# Patient Record
Sex: Female | Born: 1975 | Race: White | Hispanic: No | Marital: Married | Smoking: Current every day smoker
Health system: Southern US, Community
[De-identification: ages and names within clinical notes are randomized; demographics above are authoritative.]

## PROBLEM LIST (undated history)

## (undated) DIAGNOSIS — K219 Gastro-esophageal reflux disease without esophagitis: Secondary | ICD-10-CM

## (undated) DIAGNOSIS — M1990 Unspecified osteoarthritis, unspecified site: Secondary | ICD-10-CM

## (undated) DIAGNOSIS — F419 Anxiety disorder, unspecified: Secondary | ICD-10-CM

## (undated) DIAGNOSIS — W3400XA Accidental discharge from unspecified firearms or gun, initial encounter: Secondary | ICD-10-CM

## (undated) DIAGNOSIS — R112 Nausea with vomiting, unspecified: Secondary | ICD-10-CM

## (undated) DIAGNOSIS — N809 Endometriosis, unspecified: Secondary | ICD-10-CM

## (undated) DIAGNOSIS — K589 Irritable bowel syndrome without diarrhea: Secondary | ICD-10-CM

## (undated) DIAGNOSIS — Z9889 Other specified postprocedural states: Secondary | ICD-10-CM

## (undated) DIAGNOSIS — K4090 Unilateral inguinal hernia, without obstruction or gangrene, not specified as recurrent: Secondary | ICD-10-CM

## (undated) HISTORY — DX: Endometriosis, unspecified: N80.9

## (undated) HISTORY — PX: OTHER SURGICAL HISTORY: SHX169

## (undated) HISTORY — PX: SHOULDER SURGERY: SHX246

## (undated) HISTORY — DX: Irritable bowel syndrome, unspecified: K58.9

## (undated) HISTORY — PX: HERNIA REPAIR: SHX51

## (undated) HISTORY — PX: ABDOMINAL HYSTERECTOMY: SHX81

## (undated) HISTORY — DX: Gastro-esophageal reflux disease without esophagitis: K21.9

---

## 2002-08-28 ENCOUNTER — Other Ambulatory Visit: Admission: RE | Admit: 2002-08-28 | Discharge: 2002-08-28 | Payer: Self-pay | Admitting: Obstetrics and Gynecology

## 2003-03-05 ENCOUNTER — Encounter (INDEPENDENT_AMBULATORY_CARE_PROVIDER_SITE_OTHER): Payer: Self-pay | Admitting: Specialist

## 2003-03-05 ENCOUNTER — Observation Stay (HOSPITAL_COMMUNITY): Admission: AD | Admit: 2003-03-05 | Discharge: 2003-03-06 | Payer: Self-pay | Admitting: Obstetrics and Gynecology

## 2003-10-12 ENCOUNTER — Other Ambulatory Visit: Admission: RE | Admit: 2003-10-12 | Discharge: 2003-10-12 | Payer: Self-pay | Admitting: Obstetrics and Gynecology

## 2004-11-05 ENCOUNTER — Other Ambulatory Visit: Admission: RE | Admit: 2004-11-05 | Discharge: 2004-11-05 | Payer: Self-pay | Admitting: Obstetrics and Gynecology

## 2006-07-02 ENCOUNTER — Encounter: Admission: RE | Admit: 2006-07-02 | Discharge: 2006-07-02 | Payer: Self-pay | Admitting: General Surgery

## 2006-07-02 IMAGING — CT CT ABDOMEN W/ CM
2 of 5 series · 14 of 32 positions shown, 19 images · IV contrast (READICAT/WATER & [ID] OMNI 300)
Comparison: None.

CLINICAL DATA: Right flank and groin pain.  Nausea.  Fever.  Swelling in right groin region.  
 ABDOMEN CT WITH CONTRAST:
TECHNIQUE: Multidetector CT imaging of the abdomen was performed following the standard protocol during bolus administration of intravenous contrast.
 Contrast:  100 cc Omnipaque 300 and oral contrast.
TECHNIQUE: Multidetector CT imaging of the pelvis was performed following the standard protocol during bolus administration of intravenous contrast.

[Series 2: abdomen w/ · axial · 0.70mm/px · z∈[-416,-116]mm · 6 of 84 slices shown, 11 images]
[im 12/84  soft-tissue]
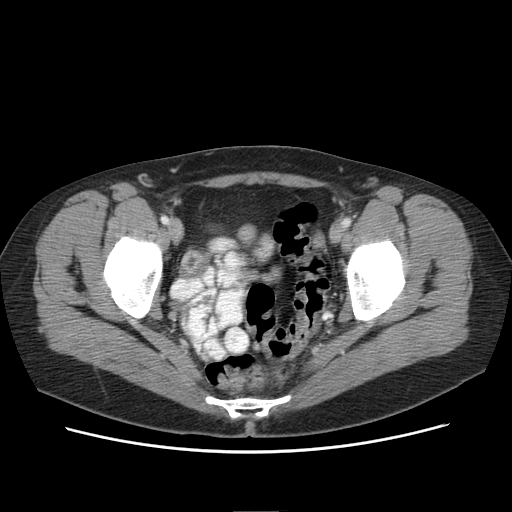
[im 12/84  bone]
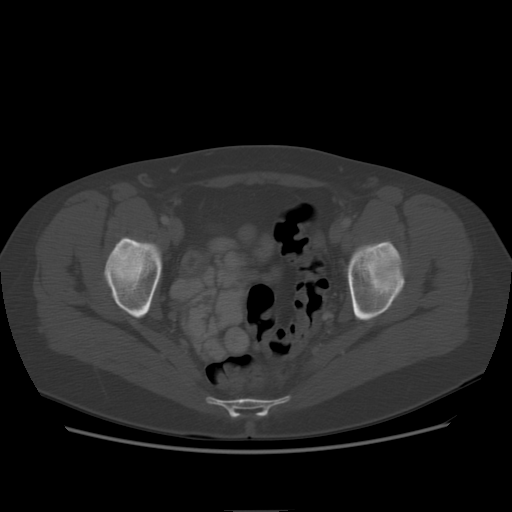
[im 24/84  soft-tissue]
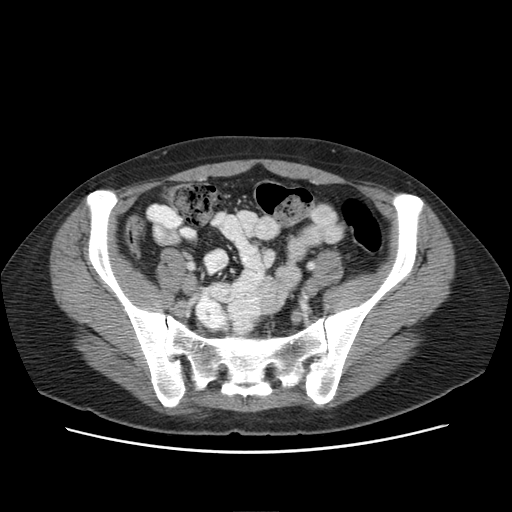
[im 36/84  soft-tissue]
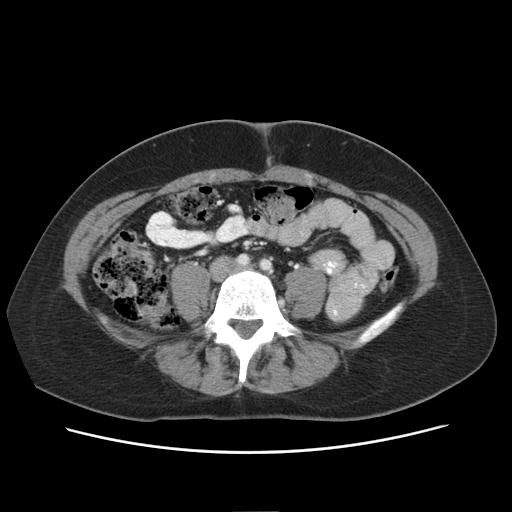
[im 36/84  lung]
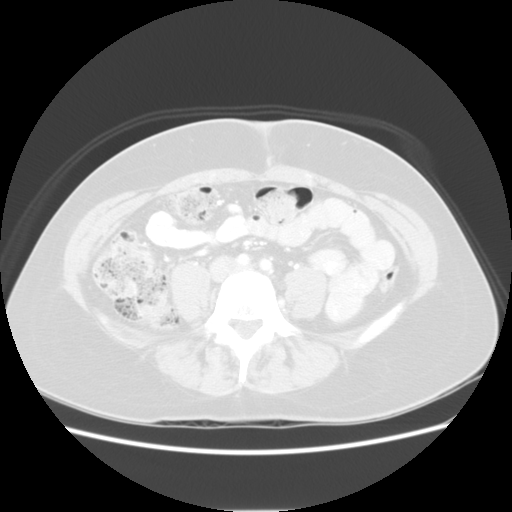
[im 48/84  soft-tissue]
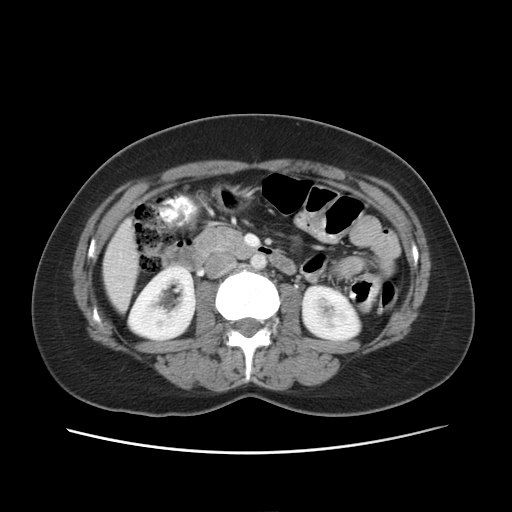
[im 48/84  lung]
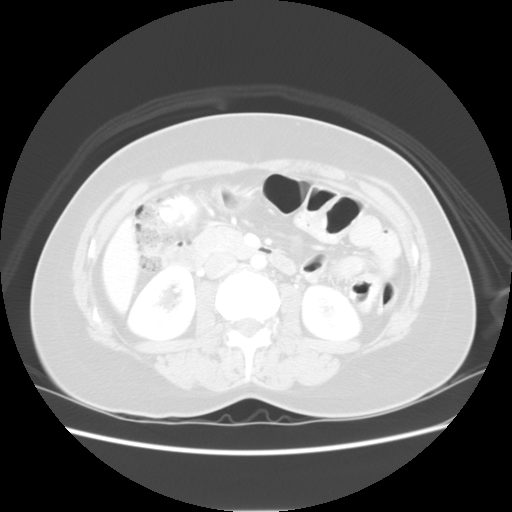
[im 60/84  soft-tissue]
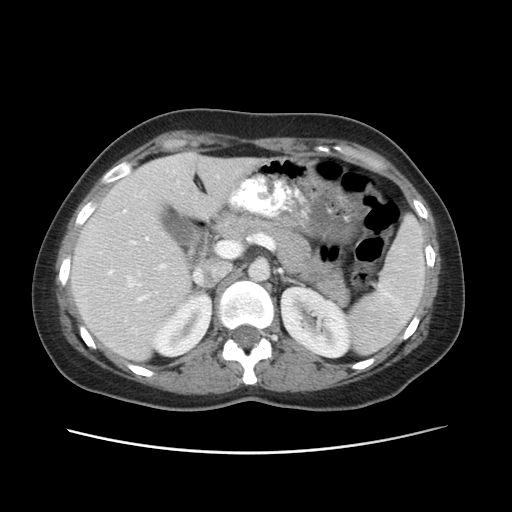
[im 60/84  lung]
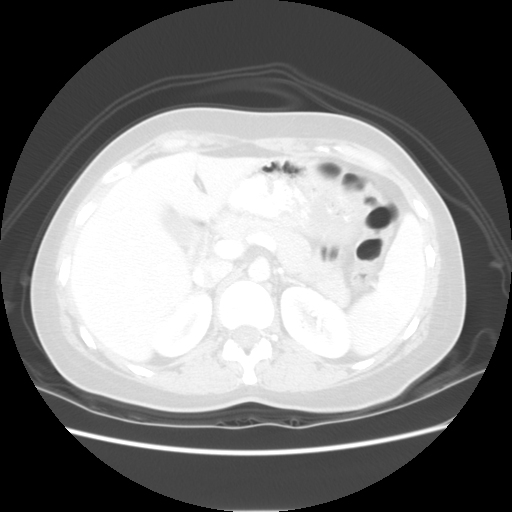
[im 72/84  soft-tissue]
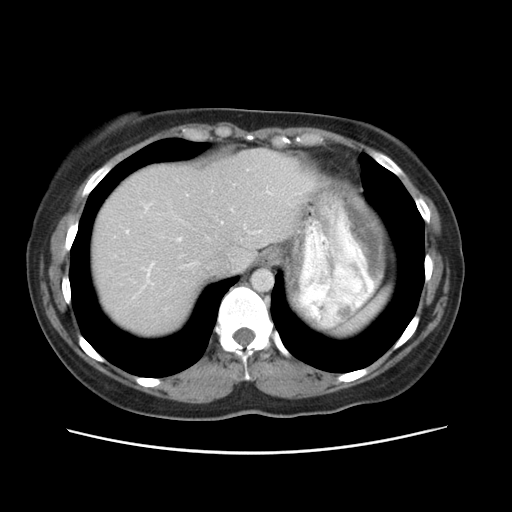
[im 72/84  lung]
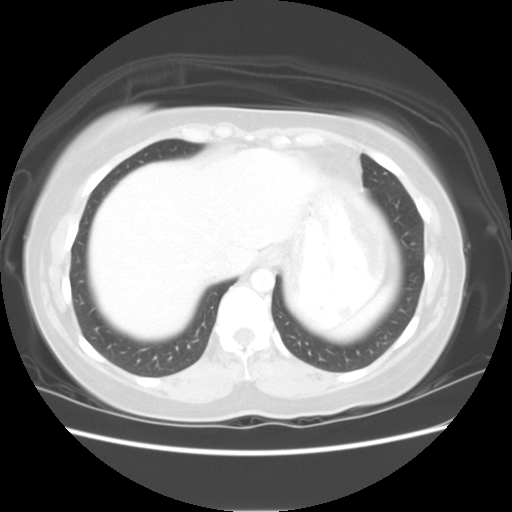

[Series 401: sagittal · sagittal · 0.89mm/px · 8 of 128 slices shown]
[im 12/128  soft-tissue]
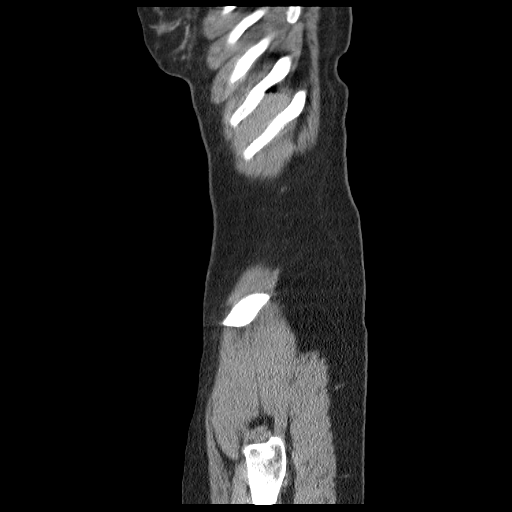
[im 24/128  soft-tissue]
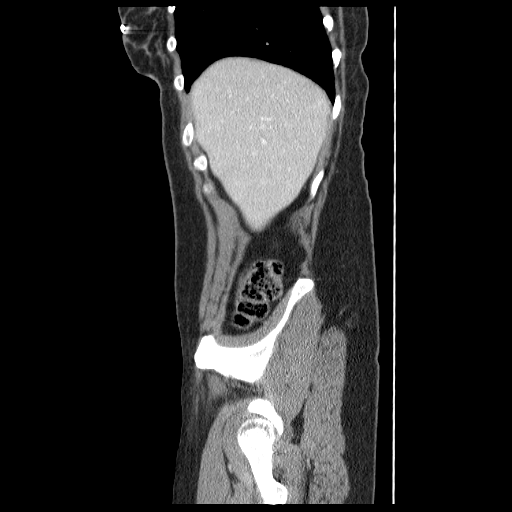
[im 47/128  soft-tissue]
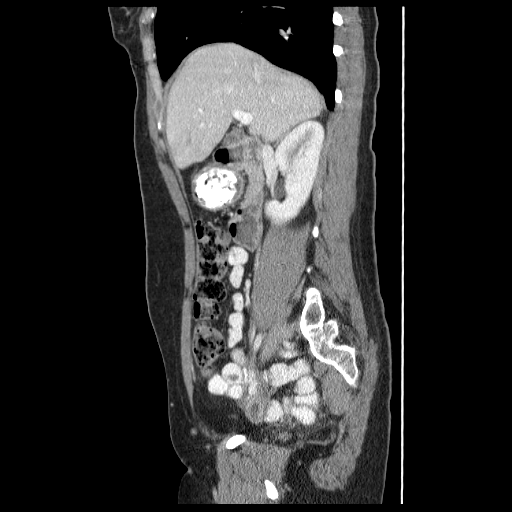
[im 58/128  soft-tissue]
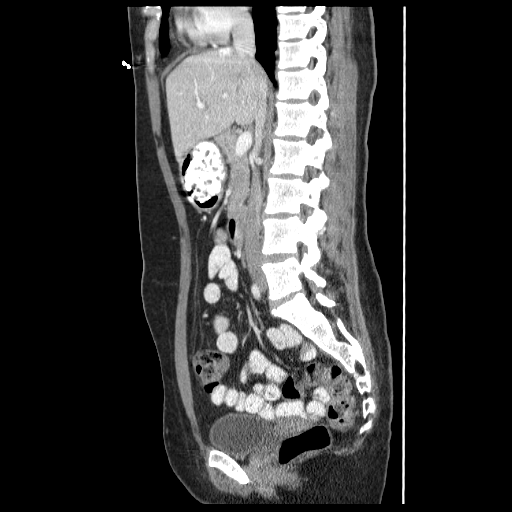
[im 70/128  soft-tissue]
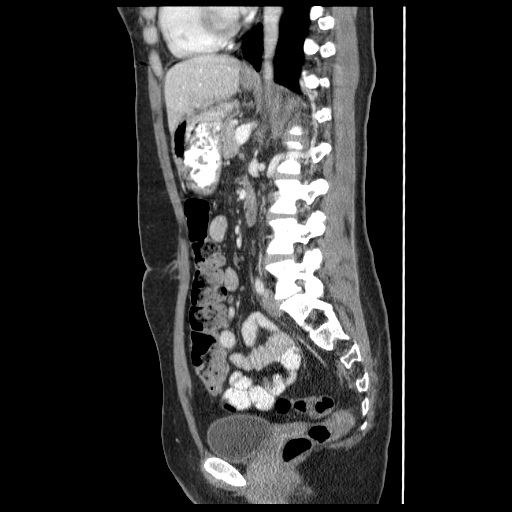
[im 81/128  soft-tissue]
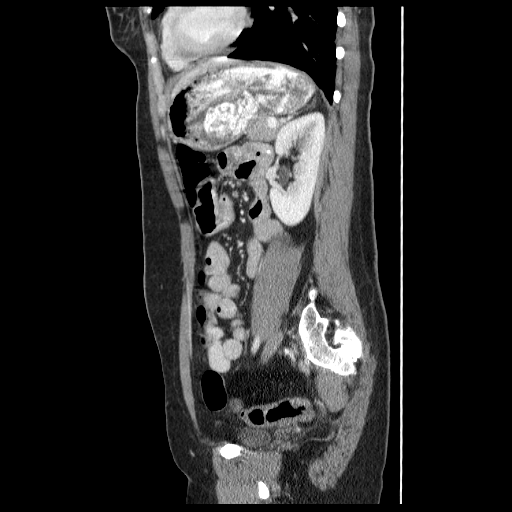
[im 104/128  soft-tissue]
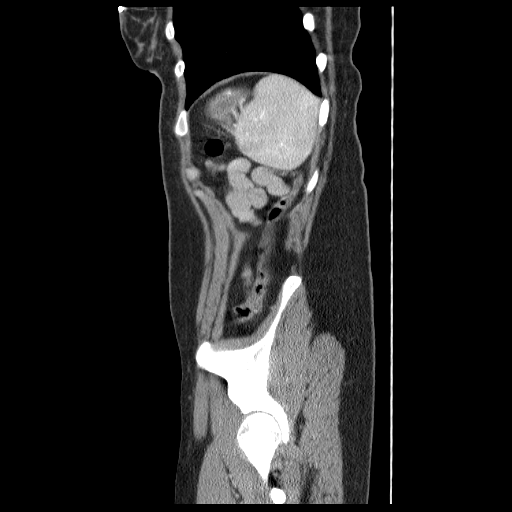
[im 116/128  soft-tissue]
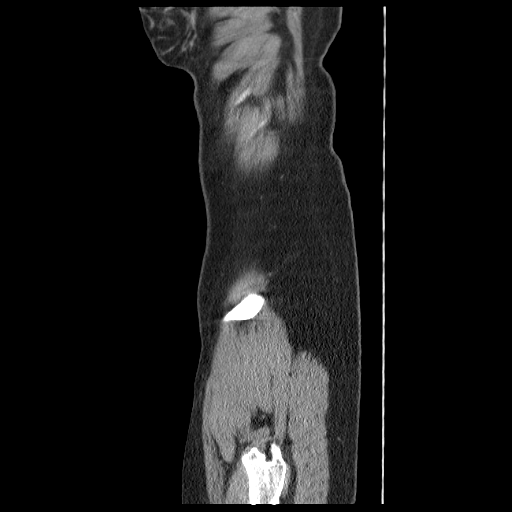

[14 of 32 positions shown; findings below may reference images not displayed]

FINDINGS: The abdominal parenchymal organs are normal in appearance.  Gallbladder is unremarkable.  There is no evidence of mass or adenopathy within the abdomen.  There is no evidence of inflammatory process or abnormal fluid collections.  No dilated bowel loops are seen.  Lung bases are clear.
IMPRESSION: Negative abdomen CT.  
 PELVIS CT WITH CONTRAST:
FINDINGS: Normal appendix is visualized.  There is no evidence of mass or adenopathy within the pelvis.  There is no evidence of hernia.  There is no evidence of inflammatory process or abnormal fluid collections.  No dilated bowel loops are seen.
IMPRESSION: Negative pelvis CT.

## 2007-11-24 ENCOUNTER — Ambulatory Visit: Payer: Self-pay | Admitting: Gastroenterology

## 2007-11-24 DIAGNOSIS — R1031 Right lower quadrant pain: Secondary | ICD-10-CM | POA: Insufficient documentation

## 2007-11-24 DIAGNOSIS — K5900 Constipation, unspecified: Secondary | ICD-10-CM | POA: Insufficient documentation

## 2007-11-24 DIAGNOSIS — K625 Hemorrhage of anus and rectum: Secondary | ICD-10-CM | POA: Insufficient documentation

## 2007-11-24 LAB — CONVERTED CEMR LAB
Basophils Absolute: 0 10*3/uL (ref 0.0–0.1)
Basophils Relative: 0.4 % (ref 0.0–1.0)
Bilirubin, Direct: 0.1 mg/dL (ref 0.0–0.3)
CO2: 27 meq/L (ref 19–32)
Calcium: 9.1 mg/dL (ref 8.4–10.5)
Creatinine, Ser: 0.8 mg/dL (ref 0.4–1.2)
Ferritin: 54.7 ng/mL (ref 10.0–291.0)
GFR calc Af Amer: 108 mL/min
Hemoglobin: 12.9 g/dL (ref 12.0–15.0)
Lymphocytes Relative: 39.2 % (ref 12.0–46.0)
MCHC: 33.9 g/dL (ref 30.0–36.0)
Monocytes Absolute: 0.4 10*3/uL (ref 0.1–1.0)
Monocytes Relative: 9.5 % (ref 3.0–12.0)
Neutro Abs: 1.9 10*3/uL (ref 1.4–7.7)
Neutrophils Relative %: 48.5 % (ref 43.0–77.0)
Platelets: 213 10*3/uL (ref 150–400)
Saturation Ratios: 19.6 % — ABNORMAL LOW (ref 20.0–50.0)
Sed Rate: 16 mm/hr (ref 0–22)
Sodium: 138 meq/L (ref 135–145)
TSH: 1.24 microintl units/mL (ref 0.35–5.50)
Total Bilirubin: 0.5 mg/dL (ref 0.3–1.2)
Total Protein: 7.3 g/dL (ref 6.0–8.3)
Vitamin B-12: 275 pg/mL (ref 211–911)

## 2008-06-21 ENCOUNTER — Ambulatory Visit: Payer: Self-pay | Admitting: Gastroenterology

## 2008-06-22 ENCOUNTER — Ambulatory Visit (HOSPITAL_COMMUNITY): Admission: RE | Admit: 2008-06-22 | Discharge: 2008-06-22 | Payer: Self-pay | Admitting: Obstetrics and Gynecology

## 2008-06-22 IMAGING — CT CT ABDOMEN WO/W CM
4 of 6 series · 15 of 32 positions shown, 19 images · IV contrast ([ID] READICAT & [ID] OMNIP 300%)
Comparison: [DATE]

CT ABDOMEN

CLINICAL DATA: Right lower abdominal and pelvic pain.  Lower GI
bleeding.  Evaluate for urinary calculi and appendicitis.

CT ABDOMEN WITHOUT AND WITH CONTRAST
CT PELVIS WITH CONTRAST
TECHNIQUE: Multidetector CT imaging of the abdomen was performed
initially following the standard protocol before administration of
intravenous contrast.  Multidetector CT imaging of the abdomen and
pelvis was then performed following the standard protocol during
the bolus injection of intravenous contrast.
Contrast: 100 ml [7J] and oral contrast

[Series 3: abd pelvis · axial · 0.70mm/px · z∈[-335,-135]mm · 3 of 82 slices shown, 7 images]
[im 21/82  soft-tissue]
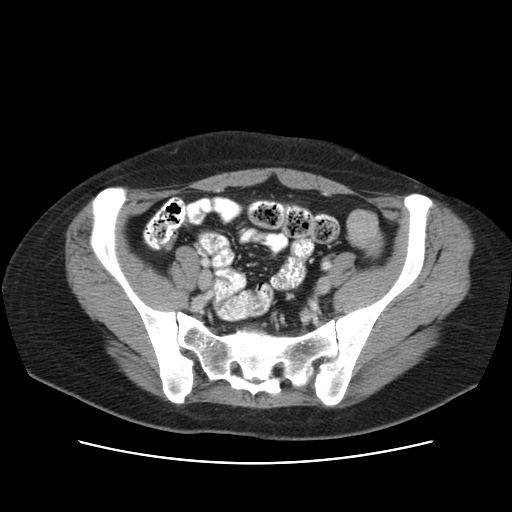
[im 21/82  lung]
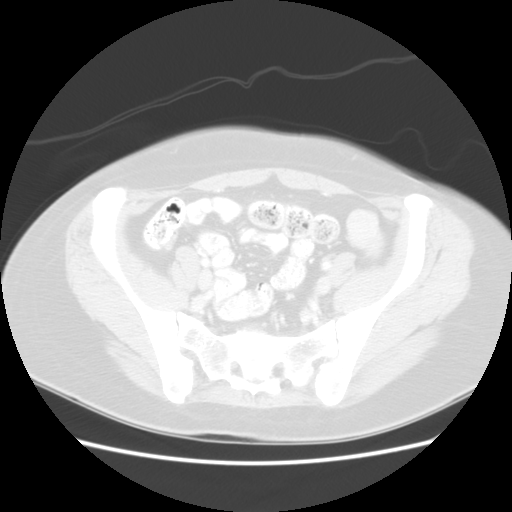
[im 21/82  bone]
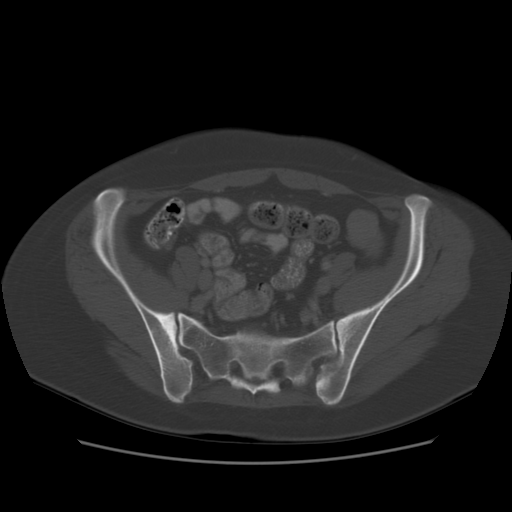
[im 41/82  soft-tissue]
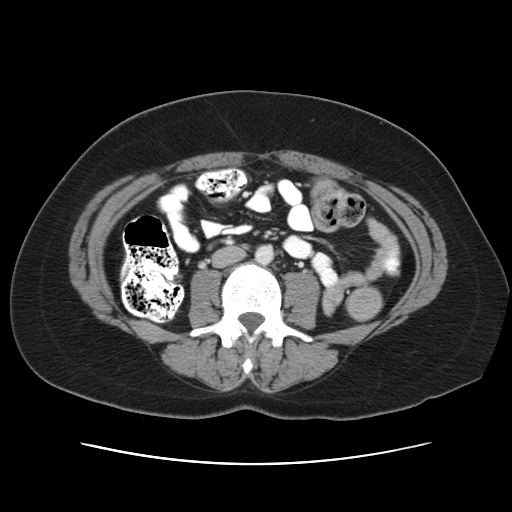
[im 41/82  lung]
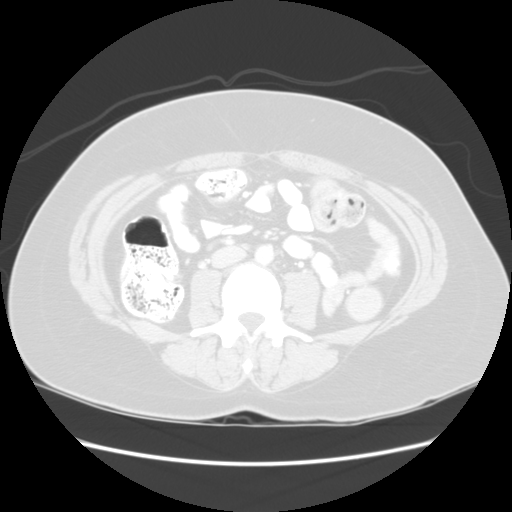
[im 61/82  soft-tissue]
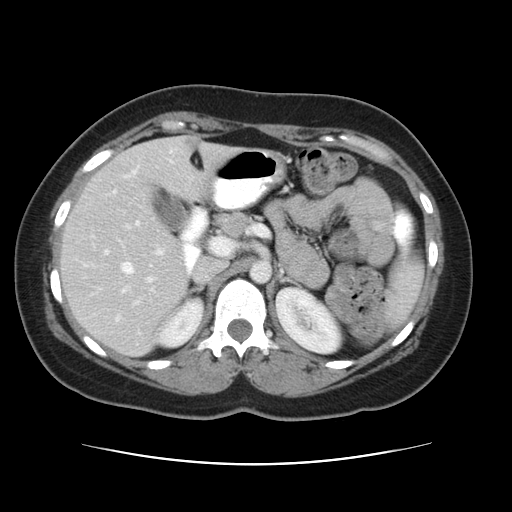
[im 61/82  lung]
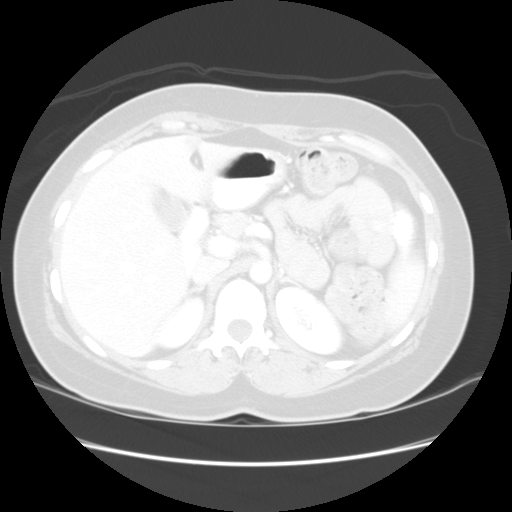

[Series 6: renal delay · axial · delayed · 0.70mm/px · z∈[-324,-214]mm · 2 of 67 slices shown]
[im 23/67  soft-tissue]
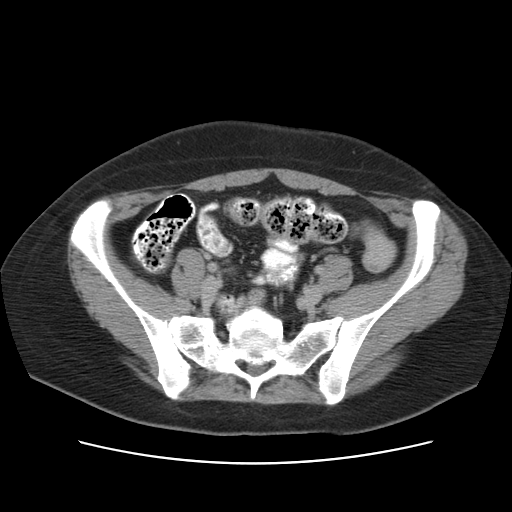
[im 45/67  soft-tissue]
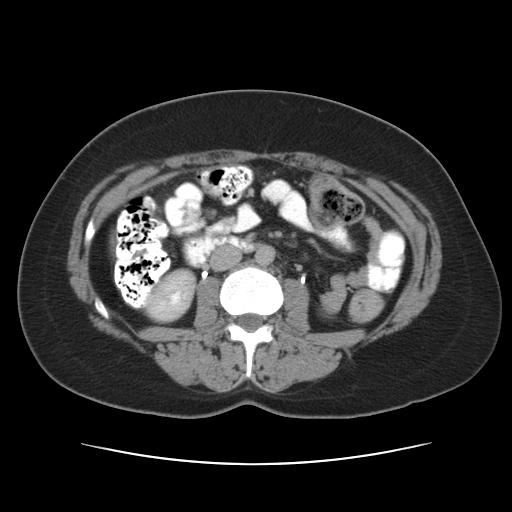

[Series 500: reformatted · coronal · 0.86mm/px · 2 of 146 slices shown (1 of 2)]
[im 19/146  soft-tissue]
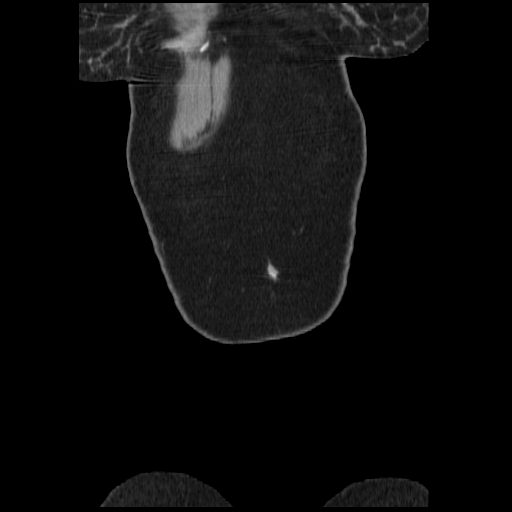
[im 37/146  soft-tissue]
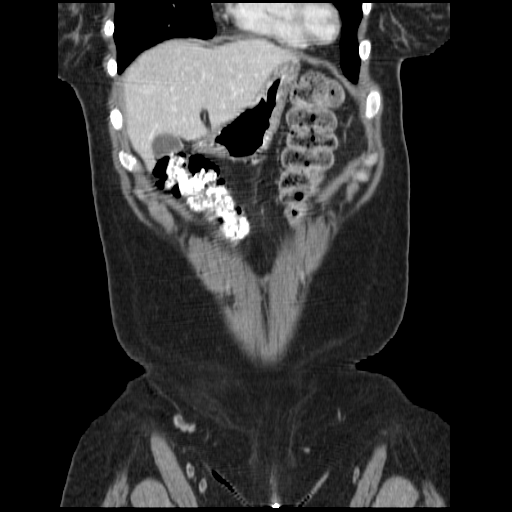

[Series 501: reformatted · sagittal · 0.86mm/px · 8 of 174 slices shown (2 of 2)]
[im 20/174  soft-tissue]
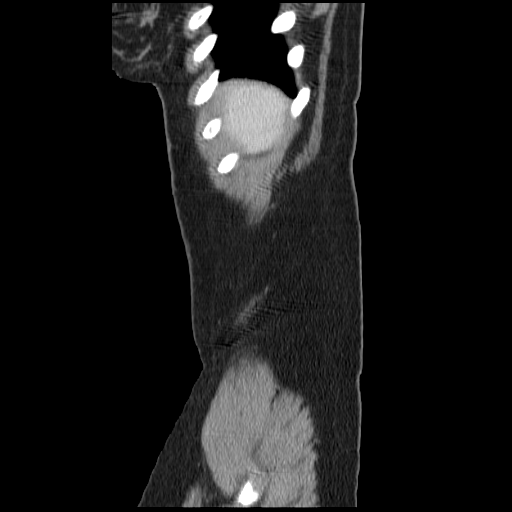
[im 39/174  soft-tissue]
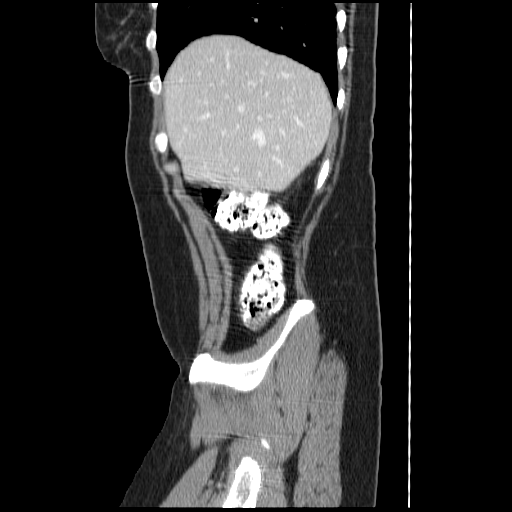
[im 58/174  soft-tissue]
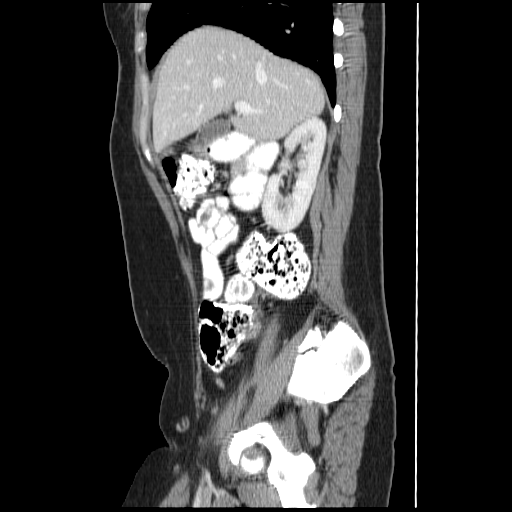
[im 77/174  soft-tissue]
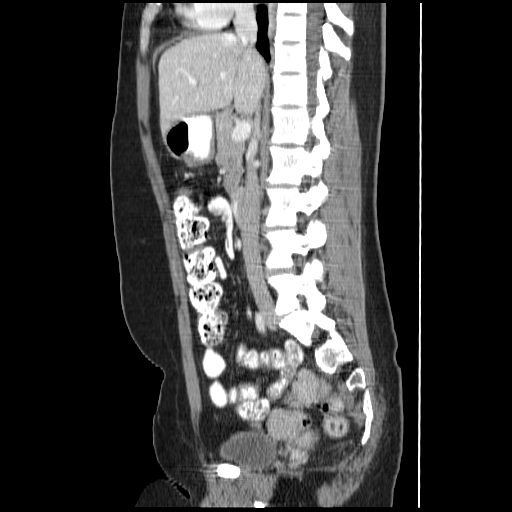
[im 97/174  soft-tissue]
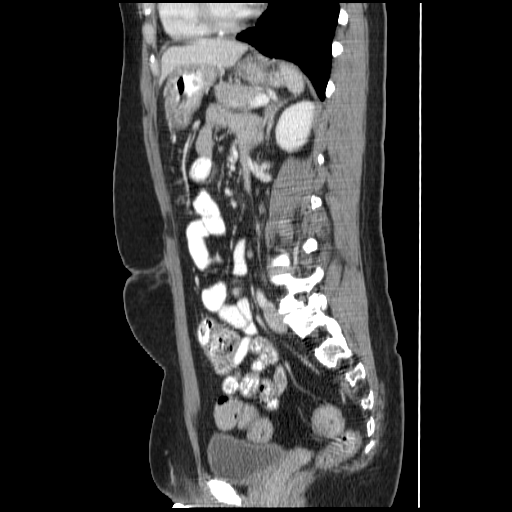
[im 116/174  soft-tissue]
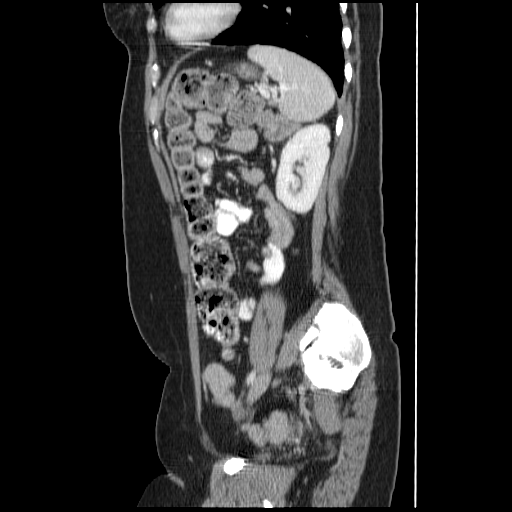
[im 135/174  soft-tissue]
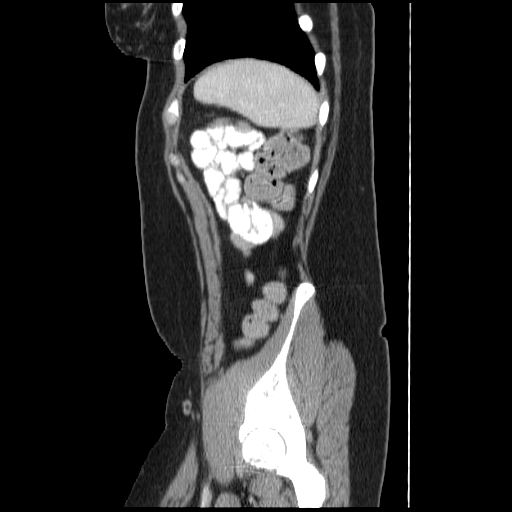
[im 154/174  soft-tissue]
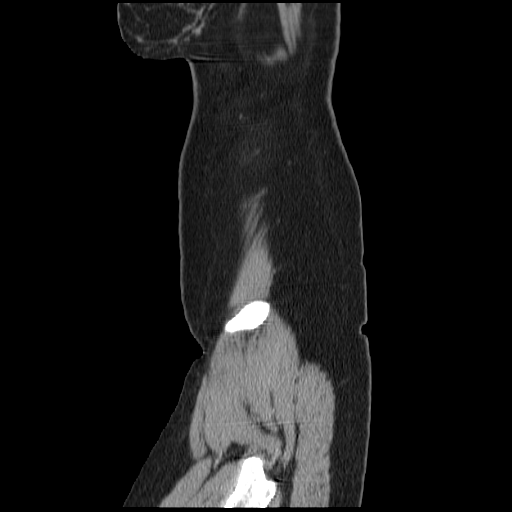

[15 of 32 positions shown; findings below may reference images not displayed]

FINDINGS: Precontrast images show no evidence renal calculi or
hydronephrosis.  Postcontrast images show no evidence of renal mass
or other parenchymal abnormality.

The liver, gallbladder, spleen, pancreas, and adrenal glands are
normal appearance.  There is no evidence of abdominal soft tissue
masses or lymphadenopathy.  There is no evidence of inflammatory
process or abnormal fluid collections.
IMPRESSION: Negative abdomen CT.

CT PELVIS
FINDINGS: There is no evidence of ureteral calculi or dilatation.
Normal contrast desiccation ureters and urinary bladder is seen on
delayed images.  Normal appendix is visualized.  Patient has
undergone previous hysterectomy.

There is no evidence of pelvic soft tissue masses or
lymphadenopathy.  There is no evidence of inflammatory process or
abnormal fluid collections.  No hernia identified.
IMPRESSION: Negative pelvis CT.

## 2008-06-28 ENCOUNTER — Telehealth: Payer: Self-pay | Admitting: Gastroenterology

## 2008-07-04 ENCOUNTER — Telehealth: Payer: Self-pay | Admitting: Gastroenterology

## 2008-07-04 DIAGNOSIS — R1319 Other dysphagia: Secondary | ICD-10-CM | POA: Insufficient documentation

## 2008-07-06 ENCOUNTER — Encounter: Payer: Self-pay | Admitting: Gastroenterology

## 2008-07-06 ENCOUNTER — Ambulatory Visit: Payer: Self-pay | Admitting: Gastroenterology

## 2008-07-06 DIAGNOSIS — K2970 Gastritis, unspecified, without bleeding: Secondary | ICD-10-CM | POA: Insufficient documentation

## 2008-07-06 DIAGNOSIS — K2990 Gastroduodenitis, unspecified, without bleeding: Secondary | ICD-10-CM

## 2008-07-09 ENCOUNTER — Encounter: Payer: Self-pay | Admitting: Gastroenterology

## 2008-07-10 ENCOUNTER — Encounter: Payer: Self-pay | Admitting: Gastroenterology

## 2008-07-20 ENCOUNTER — Ambulatory Visit: Payer: Self-pay | Admitting: Gastroenterology

## 2008-07-25 ENCOUNTER — Telehealth: Payer: Self-pay | Admitting: Gastroenterology

## 2008-08-03 ENCOUNTER — Telehealth (INDEPENDENT_AMBULATORY_CARE_PROVIDER_SITE_OTHER): Payer: Self-pay | Admitting: *Deleted

## 2008-08-10 ENCOUNTER — Ambulatory Visit: Payer: Self-pay | Admitting: Gastroenterology

## 2008-08-10 DIAGNOSIS — K219 Gastro-esophageal reflux disease without esophagitis: Secondary | ICD-10-CM | POA: Insufficient documentation

## 2008-08-10 DIAGNOSIS — E538 Deficiency of other specified B group vitamins: Secondary | ICD-10-CM | POA: Insufficient documentation

## 2008-08-10 DIAGNOSIS — N809 Endometriosis, unspecified: Secondary | ICD-10-CM | POA: Insufficient documentation

## 2008-08-10 DIAGNOSIS — K589 Irritable bowel syndrome without diarrhea: Secondary | ICD-10-CM | POA: Insufficient documentation

## 2008-08-10 LAB — CONVERTED CEMR LAB: Tissue Transglutaminase Ab, IgA: 0.1 units (ref <7)

## 2008-08-14 ENCOUNTER — Ambulatory Visit (HOSPITAL_COMMUNITY): Admission: RE | Admit: 2008-08-14 | Discharge: 2008-08-14 | Payer: Self-pay | Admitting: Gastroenterology

## 2008-08-14 IMAGING — US US ABDOMEN COMPLETE
1 series · 14 of 25 positions shown · non-contrast
Comparison: CT abdomen lung 22-[MU]

CLINICAL DATA: Right side abdominal pain

ABDOMEN ULTRASOUND

[Series 1: us abdomen complete · 0.24mm/px · 14 of 77 slices shown]
[im 1/77]
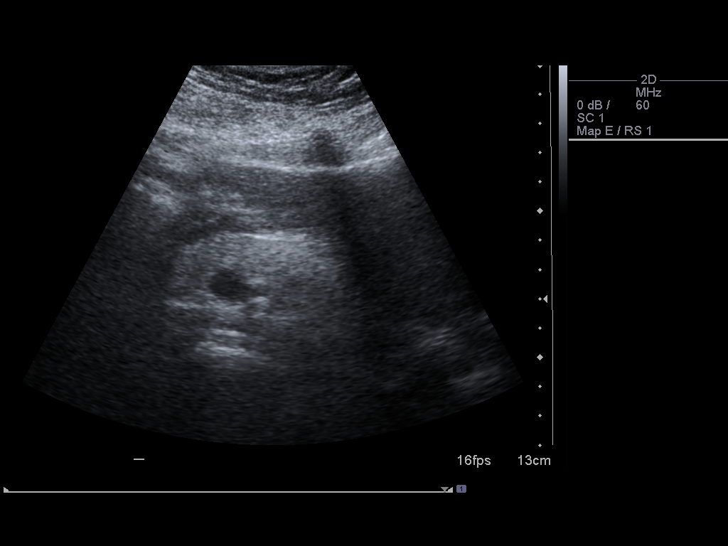
[im 7/77]
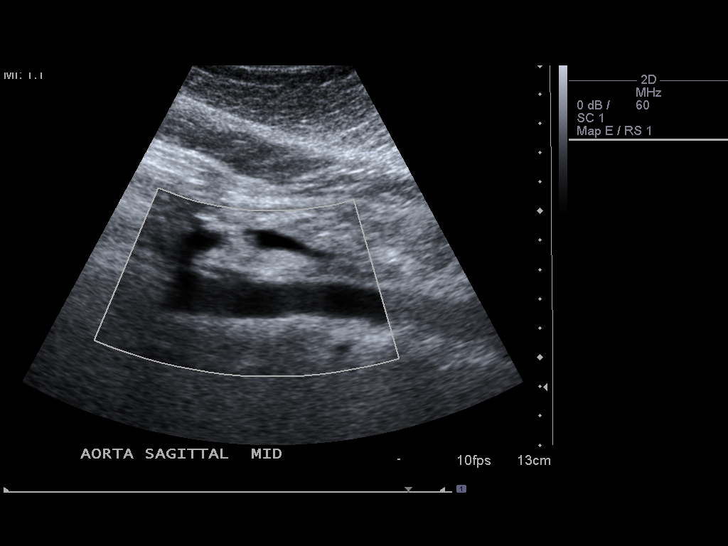
[im 13/77]
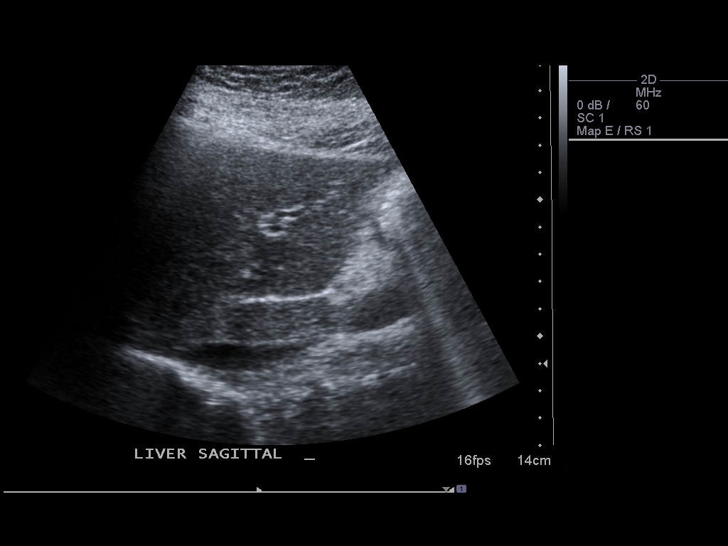
[im 20/77]
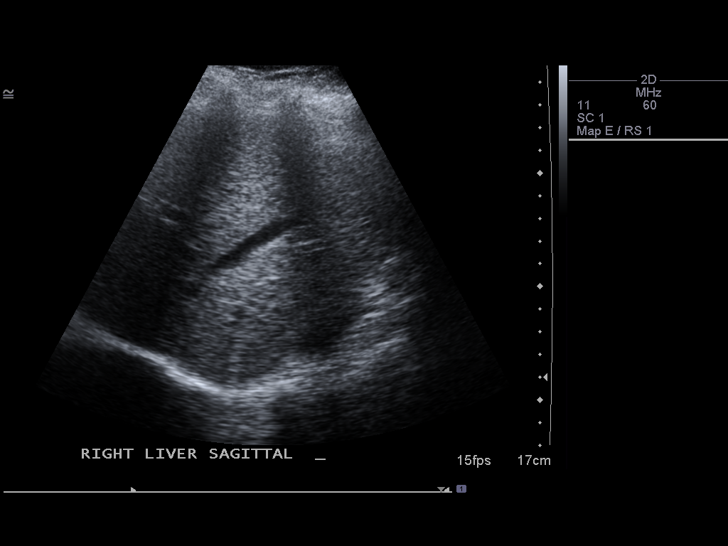
[im 26/77]
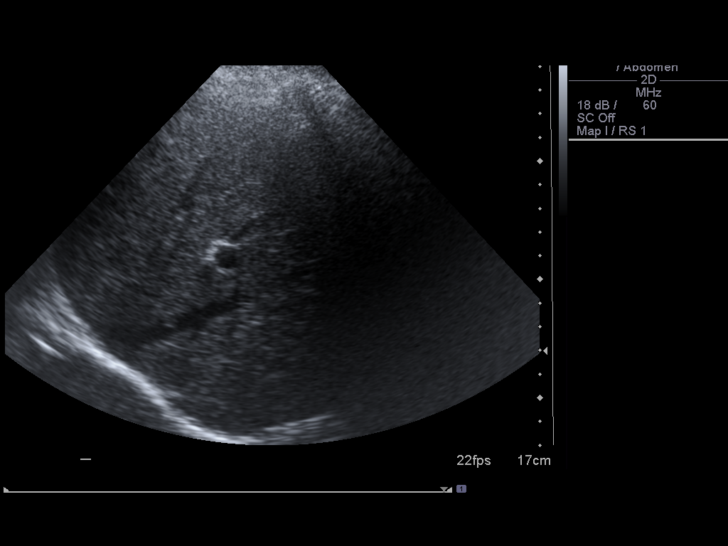
[im 29/77]
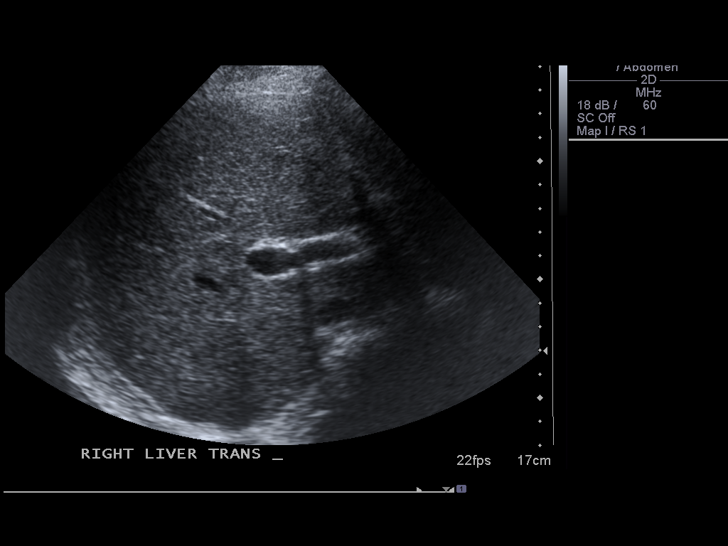
[im 35/77]
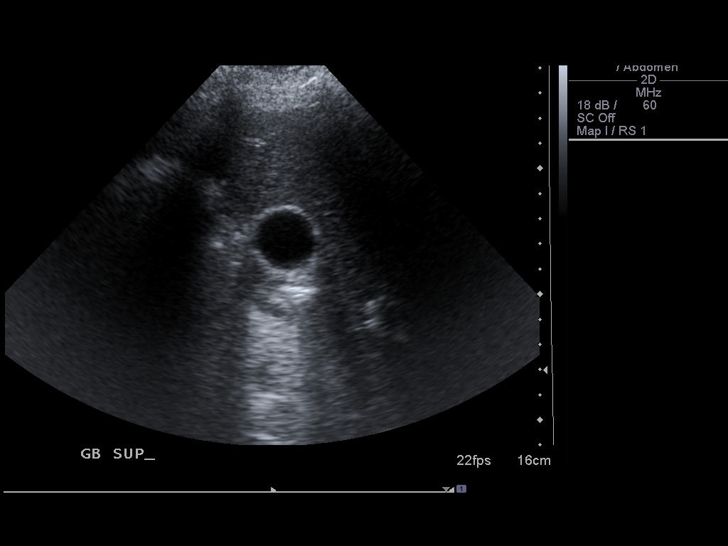
[im 42/77]
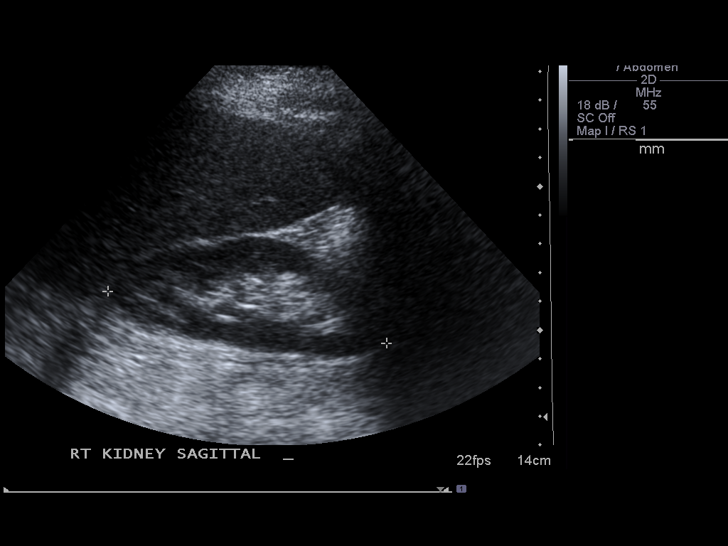
[im 48/77]
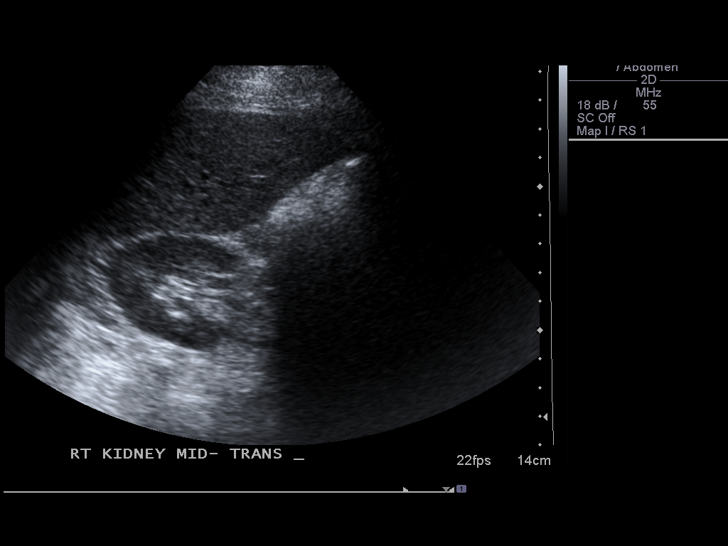
[im 51/77]
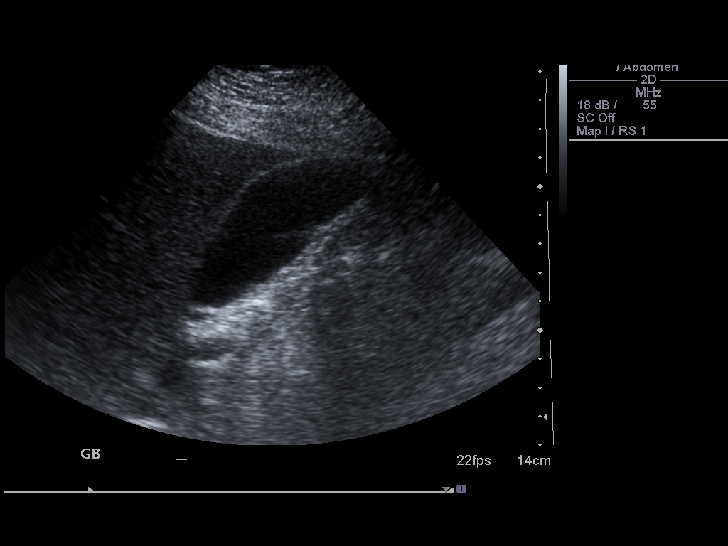
[im 58/77]
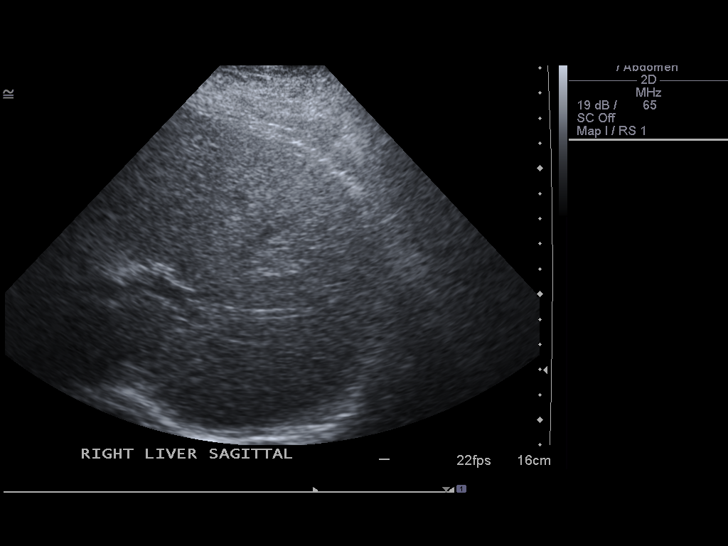
[im 64/77]
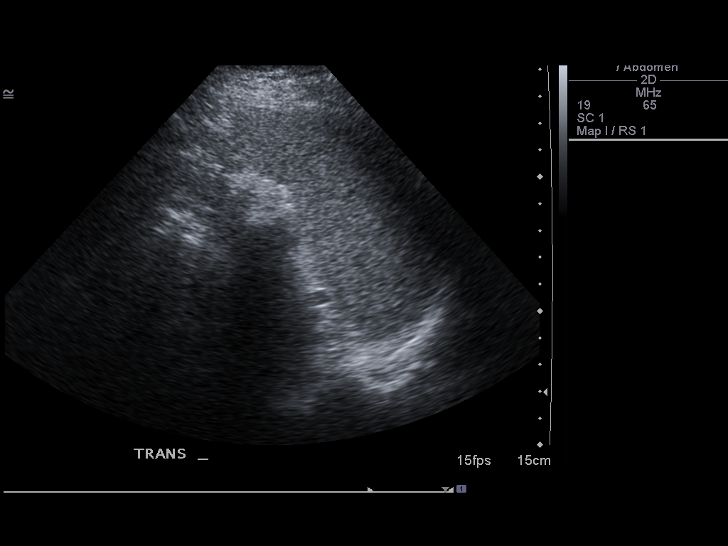
[im 70/77]
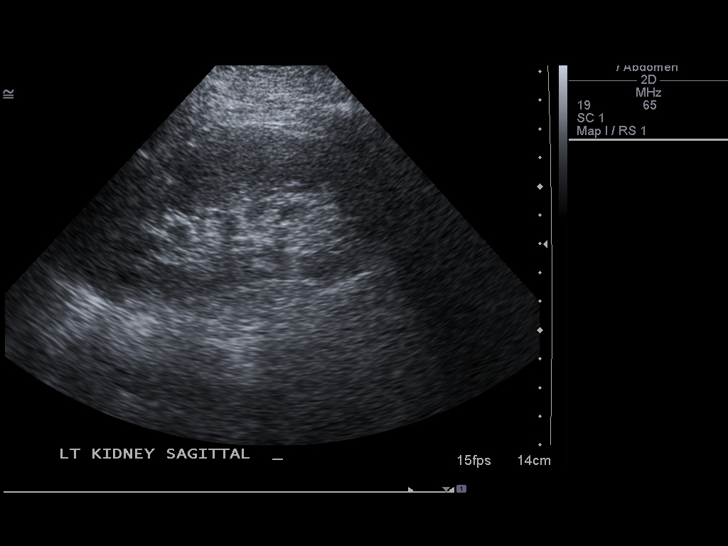
[im 77/77]
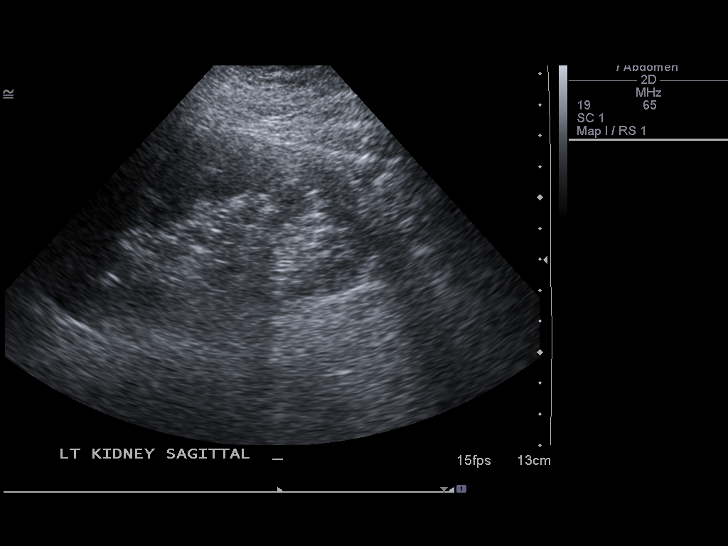

[14 of 25 positions shown; findings below may reference images not displayed]

FINDINGS: The liver is normal in contour and echogenicity.  The
gallbladder is thin-walled at 2.0 mm.  No evidence of
pericholecystic fluid.  No evidence of gallstones.  Negative
sonographic Murphy's sign.  Common bile duct is normal diameter
mm.

The IVC and pancreas appear normal.

The spleen is normal echogenicity measuring 8.7 cm.

Right kidney measures 9.9 cm.  Left kidney measures 10.8 cm. No
evidence of hydronephrosis.

Abdominal aorta is normal in diameter 1.4 cm.
IMPRESSION: No acute abdominal abnormality by ultrasound.

## 2008-08-15 ENCOUNTER — Encounter: Payer: Self-pay | Admitting: Gastroenterology

## 2008-08-17 ENCOUNTER — Ambulatory Visit: Payer: Self-pay | Admitting: Gastroenterology

## 2008-08-24 ENCOUNTER — Ambulatory Visit: Payer: Self-pay | Admitting: Gastroenterology

## 2008-08-24 DIAGNOSIS — F411 Generalized anxiety disorder: Secondary | ICD-10-CM | POA: Insufficient documentation

## 2009-03-27 ENCOUNTER — Emergency Department (HOSPITAL_COMMUNITY): Admission: EM | Admit: 2009-03-27 | Discharge: 2009-03-27 | Payer: Self-pay | Admitting: Emergency Medicine

## 2009-03-28 ENCOUNTER — Emergency Department (HOSPITAL_COMMUNITY): Admission: EM | Admit: 2009-03-28 | Discharge: 2009-03-28 | Payer: Self-pay | Admitting: Emergency Medicine

## 2009-03-28 IMAGING — CT CT HEAD W/O CM
1 of 3 series · 15 of 30 positions shown, 19 images · IV contrast (APPLIED)
Comparison: None.

CLINICAL DATA: Syncope.  Dizziness.

CT HEAD WITHOUT CONTRAST [DATE]:
TECHNIQUE: Contiguous axial images were obtained from the base of
the skull through the vertex without contrast.

[Series 9: pe thins @ 1mm · axial · 0.83mm/px · z∈[-506,-285]mm · 15 of 248 slices shown, 19 images]
[im 14/248  brain]
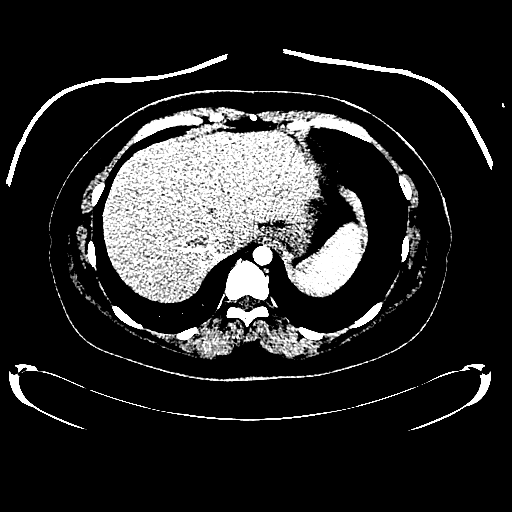
[im 14/248  bone]
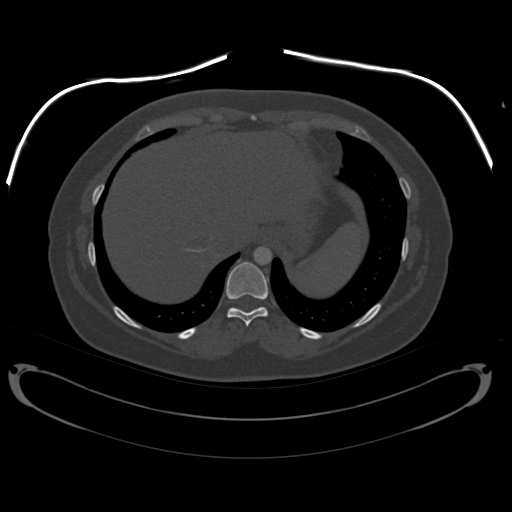
[im 27/248  brain]
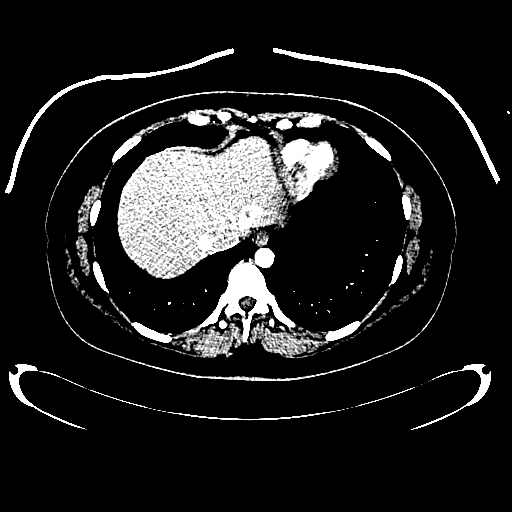
[im 53/248  brain]
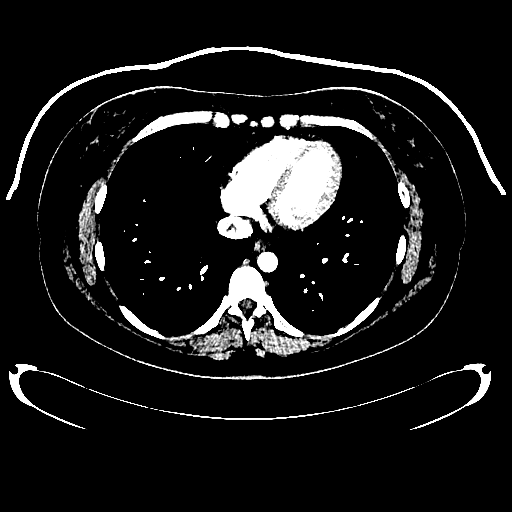
[im 66/248  brain]
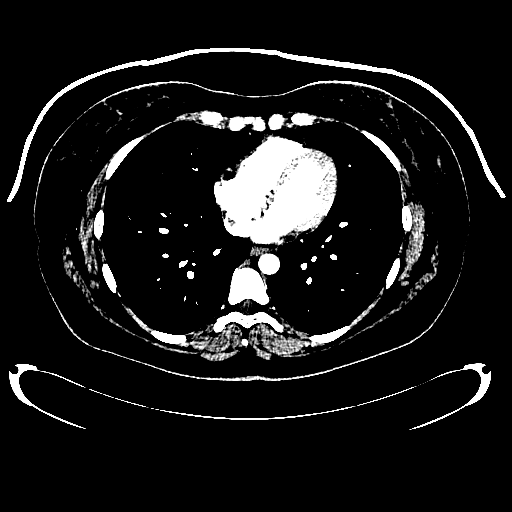
[im 79/248  brain]
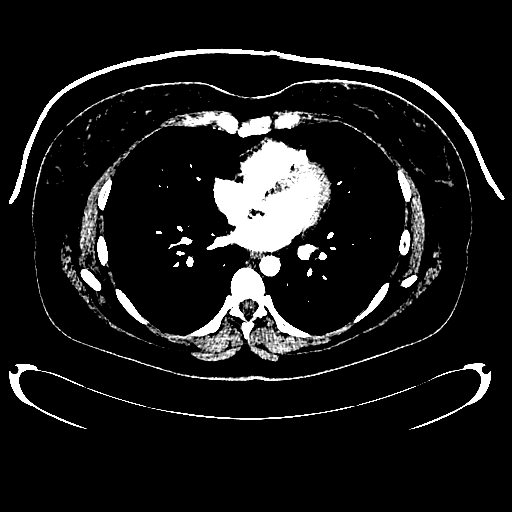
[im 79/248  bone]
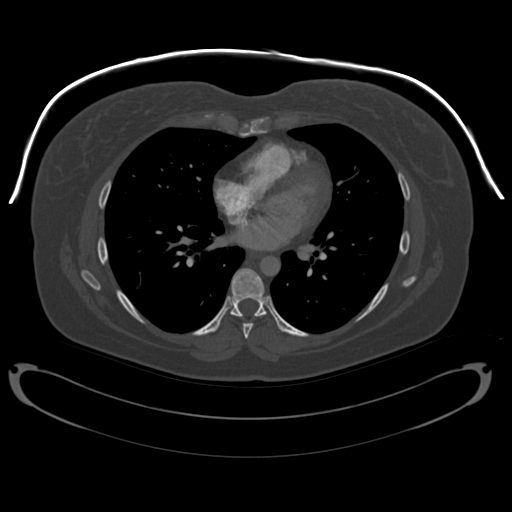
[im 92/248  brain]
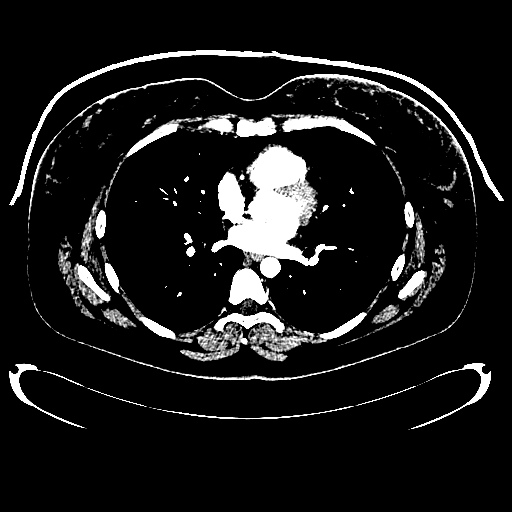
[im 105/248  brain]
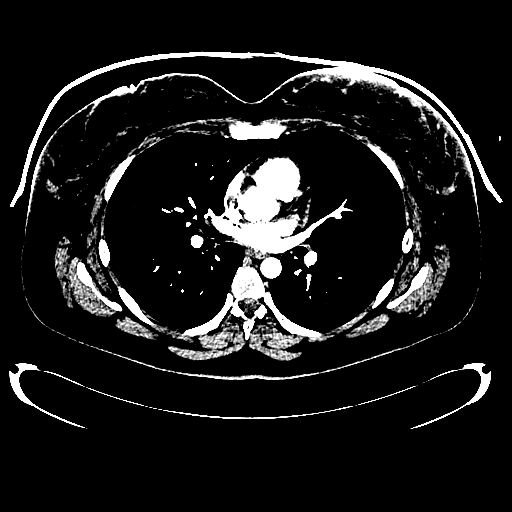
[im 131/248  brain]
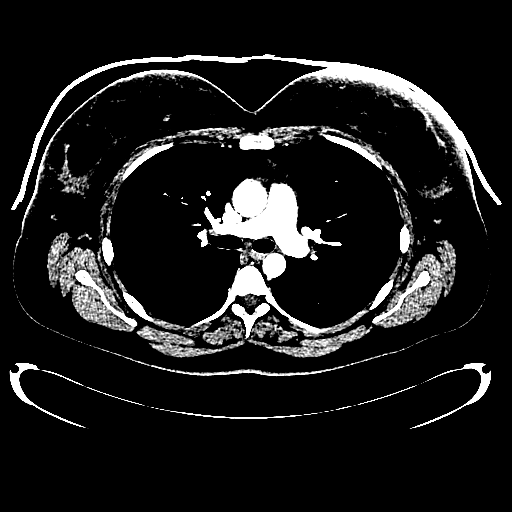
[im 144/248  brain]
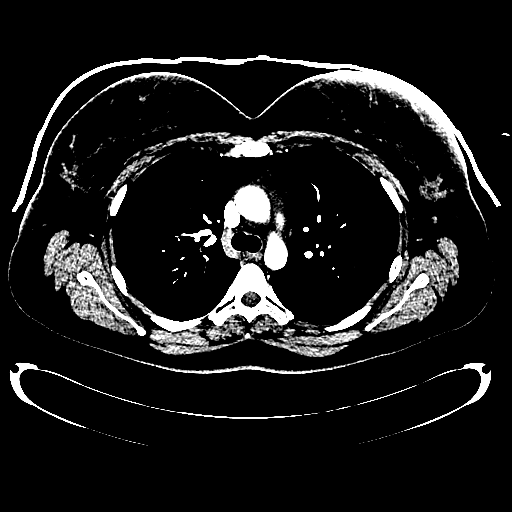
[im 144/248  bone]
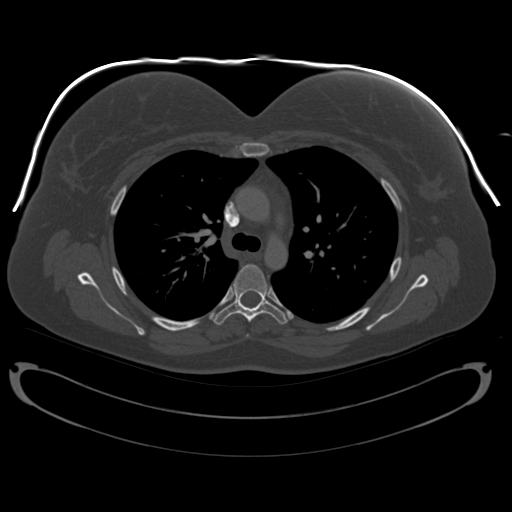
[im 157/248  brain]
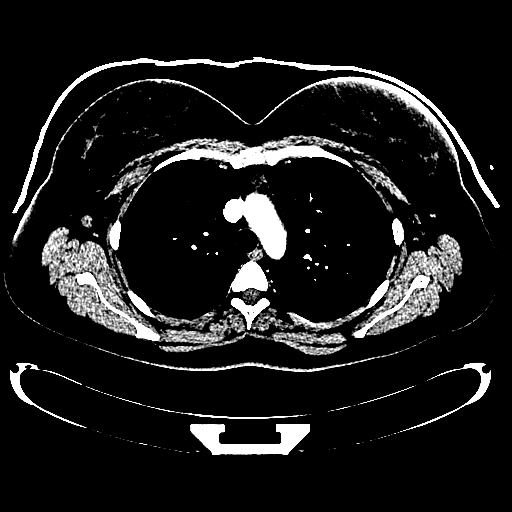
[im 170/248  brain]
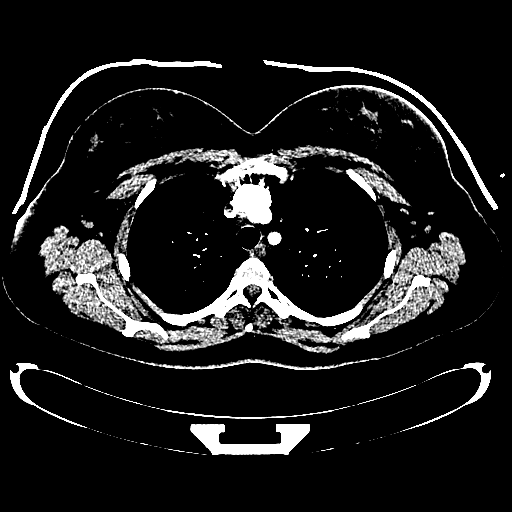
[im 183/248  brain]
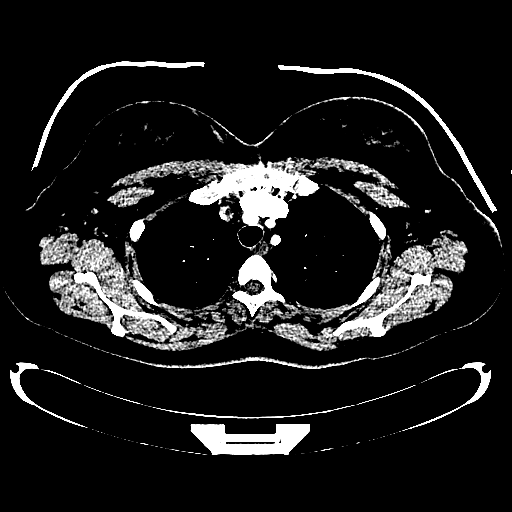
[im 209/248  brain]
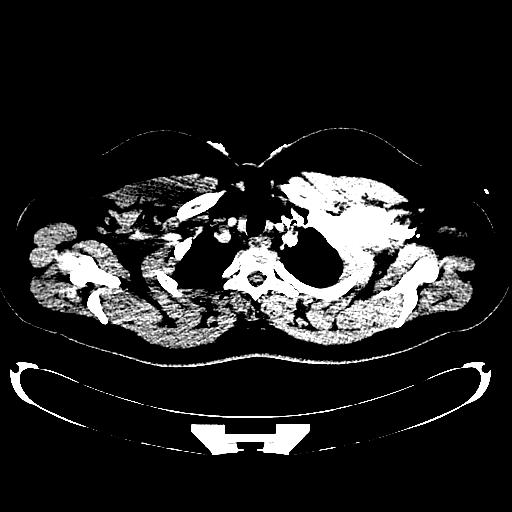
[im 209/248  bone]
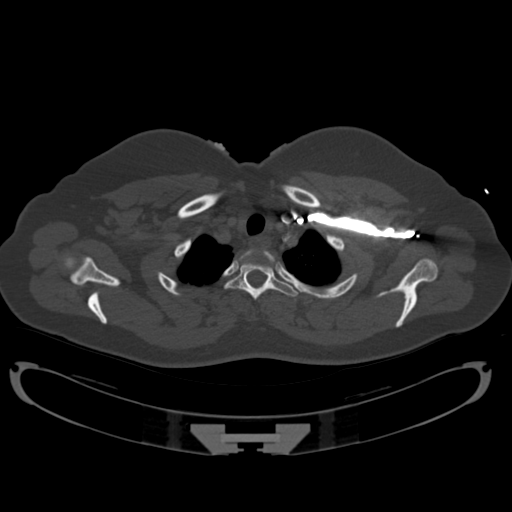
[im 222/248  brain]
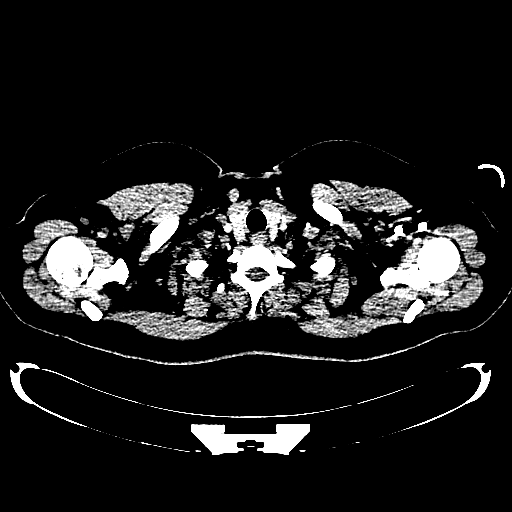
[im 235/248  brain]
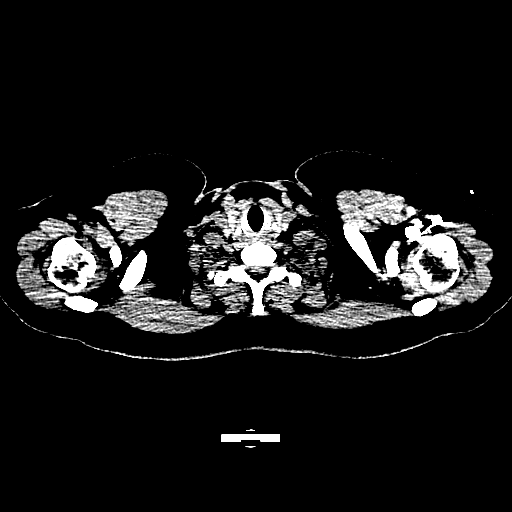

[15 of 30 positions shown; findings below may reference images not displayed]

FINDINGS: Ventricular system normal in size and appearance for age.
No mass lesion.  No midline shift.  No acute hemorrhage or
hematoma.  No extra-axial fluid collections.  No focal brain
parenchymal abnormalities.  No evidence of acute infarction.
Incidental note of a 4 mm calcified meningioma arising from the
left posterior frontal region (image 21).

No focal osseous abnormalities involving the skull.  Visualized
paranasal sinuses, mastoid air cells, and middle ear cavities well-
aerated.
IMPRESSION: No acute or significant intracranial abnormalities.  Incidental 4
mm calcified left posterior frontal meningioma.

## 2010-01-08 ENCOUNTER — Telehealth (INDEPENDENT_AMBULATORY_CARE_PROVIDER_SITE_OTHER): Payer: Self-pay | Admitting: *Deleted

## 2010-05-09 ENCOUNTER — Ambulatory Visit (HOSPITAL_COMMUNITY)
Admission: RE | Admit: 2010-05-09 | Discharge: 2010-05-09 | Payer: Self-pay | Source: Home / Self Care | Attending: Obstetrics and Gynecology | Admitting: Obstetrics and Gynecology

## 2010-05-16 ENCOUNTER — Encounter
Admission: RE | Admit: 2010-05-16 | Discharge: 2010-05-16 | Payer: Self-pay | Source: Home / Self Care | Attending: Obstetrics and Gynecology | Admitting: Obstetrics and Gynecology

## 2010-07-01 NOTE — Progress Notes (Signed)
  Phone Note Other Incoming   Request: Send information Summary of Call: .Received a completed [facility] Medical Release form. Patient is requesting records to be sent to Dr. Corene Cornea. Request forwarded to [facility].

## 2010-07-17 ENCOUNTER — Other Ambulatory Visit: Payer: Self-pay | Admitting: Obstetrics and Gynecology

## 2010-07-17 DIAGNOSIS — Z09 Encounter for follow-up examination after completed treatment for conditions other than malignant neoplasm: Secondary | ICD-10-CM

## 2010-08-05 ENCOUNTER — Other Ambulatory Visit: Payer: Self-pay | Admitting: Obstetrics and Gynecology

## 2010-08-05 ENCOUNTER — Ambulatory Visit
Admission: RE | Admit: 2010-08-05 | Discharge: 2010-08-05 | Disposition: A | Discharge status: Home / Self Care | Payer: BC Managed Care – PPO | Source: Ambulatory Visit | Attending: Obstetrics and Gynecology | Admitting: Obstetrics and Gynecology

## 2010-08-05 DIAGNOSIS — Z09 Encounter for follow-up examination after completed treatment for conditions other than malignant neoplasm: Secondary | ICD-10-CM

## 2010-08-05 DIAGNOSIS — N6320 Unspecified lump in the left breast, unspecified quadrant: Secondary | ICD-10-CM

## 2010-08-05 IMAGING — MG MM DIGITAL DIAGNOSTIC UNILAT L {BCG}
3 series · 3 of 3 positions shown · non-contrast
Comparison: [DATE].

CLINICAL DATA: Reevaluation of probably benign palpable left
breast mass.  The patient feels that this mass has increased in
size on her breast self-examination.  She states she has no history
of left breast trauma and has no history of diabetes.

DIGITAL DIAGNOSTIC LEFT BREAST MAMMOGRAM WITH CAD AND LEFT BREAST
ULTRASOUND:

[L CC]
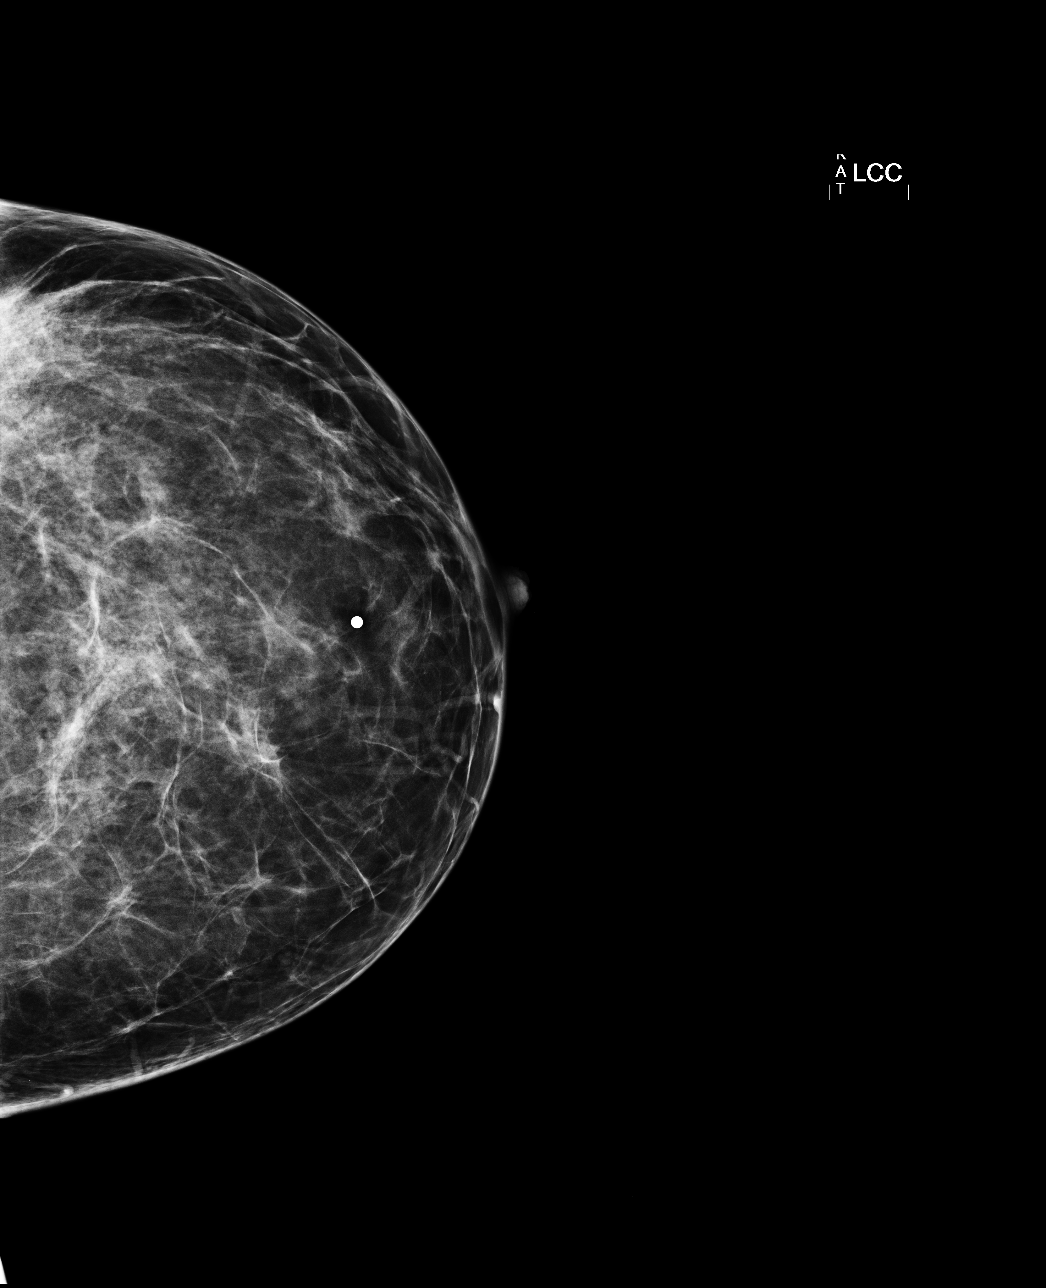

[L MLO]
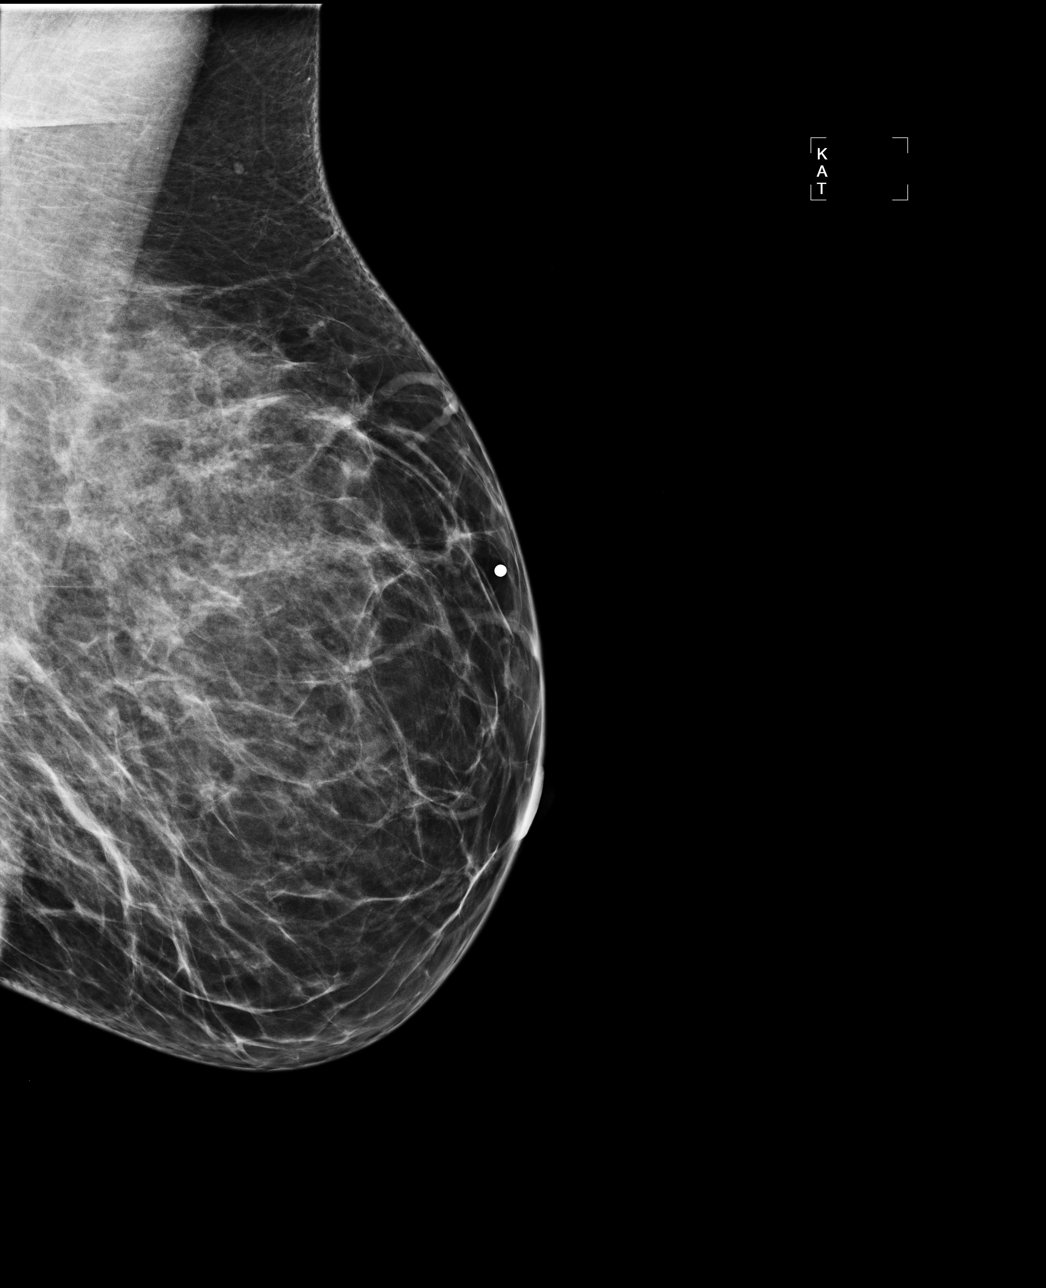

[L TAN]
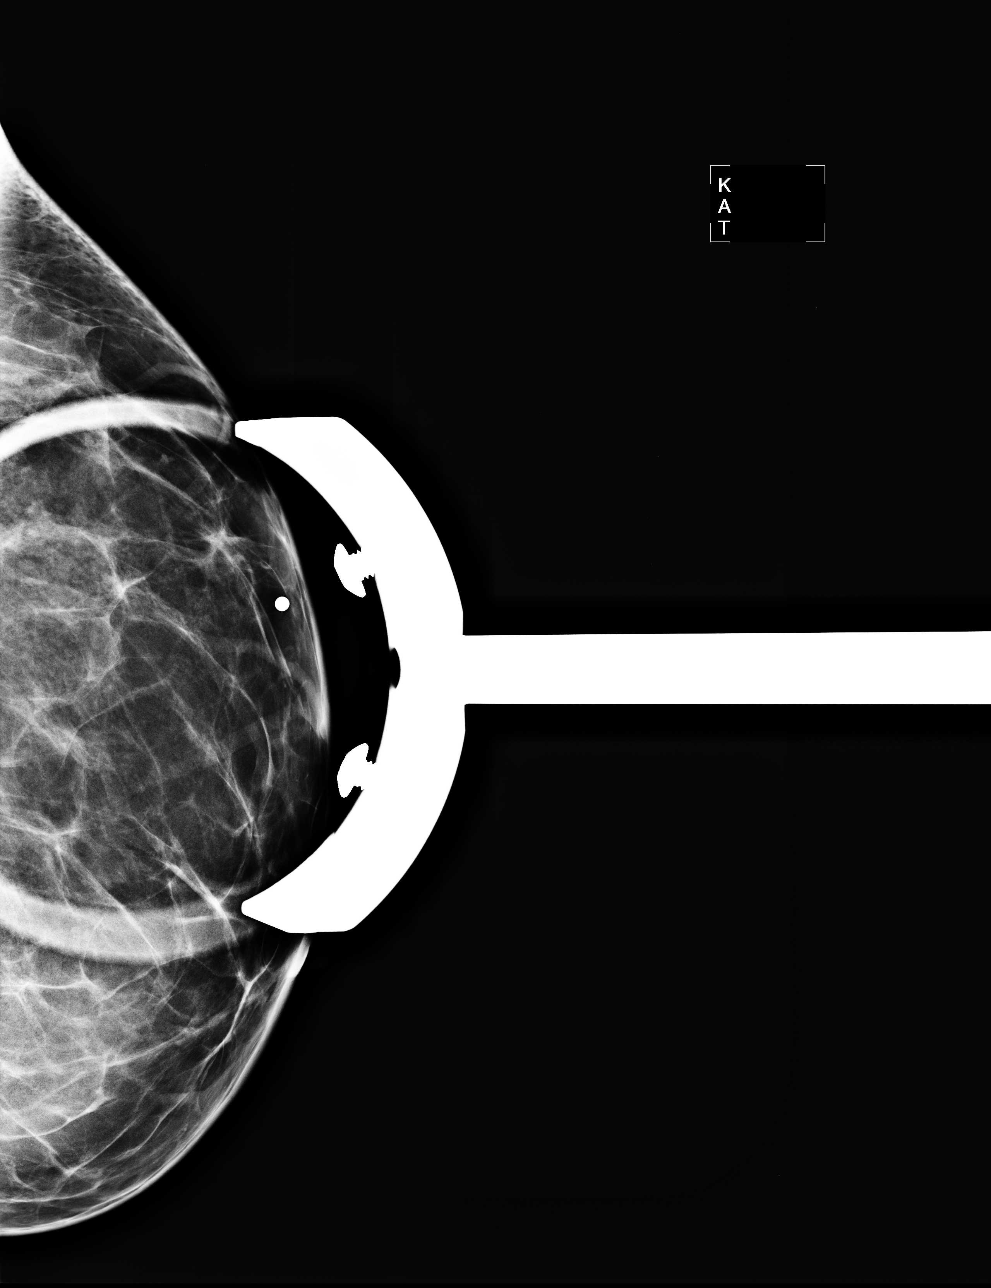

[3 of 3 positions shown; findings below may reference images not displayed]

FINDINGS: There is a scattered fibroglandular parenchymal pattern
present.  On the focally compressed spot tangential view of the
palpable abnormality within the superior left breast there is a
subtle area of questionable distortion.  There is no additional
abnormality.
Mammographic images were processed with CAD.

On physical exam, there is a firm, palpable mass located within the
left breast at the 12 o'clock position 6 cm from the nipple.

Ultrasound is performed, showing a slightly hyperechoic mass
located within the left breast at the 12 o'clock position 6 cm from
the nipple with several small lucencies present.  This measures
x 1.4 x 0.6 cm in size and has mildly increased in size when
compared to the prior study.
IMPRESSION: Mild increase in size of the palpable mass located within the left
breast at the 12 o'clock position with possible distortion seen in
this region on the spot tangential view.  This remains most likely
due to fat necrosis.  However, as this has increased in size  and
there is no clear history of trauma (and there is possible
distortion on the tangential spot views), tissue sampling is
recommended.  I have discussed ultrasound-guided core biopsy of the
area with the patient.  This will be scheduled per patient
preference.

BI-RADS CATEGORY 4:  Suspicious abnormality - biopsy should be
considered.

## 2010-08-05 IMAGING — US US BREAST L
1 series · 6 of 6 positions shown · non-contrast
Comparison: [DATE].

CLINICAL DATA: Reevaluation of probably benign palpable left
breast mass.  The patient feels that this mass has increased in
size on her breast self-examination.  She states she has no history
of left breast trauma and has no history of diabetes.

DIGITAL DIAGNOSTIC LEFT BREAST MAMMOGRAM WITH CAD AND LEFT BREAST
ULTRASOUND:

[Series 2: us breast left · 6 of 6 slices shown]
[im 1/6]
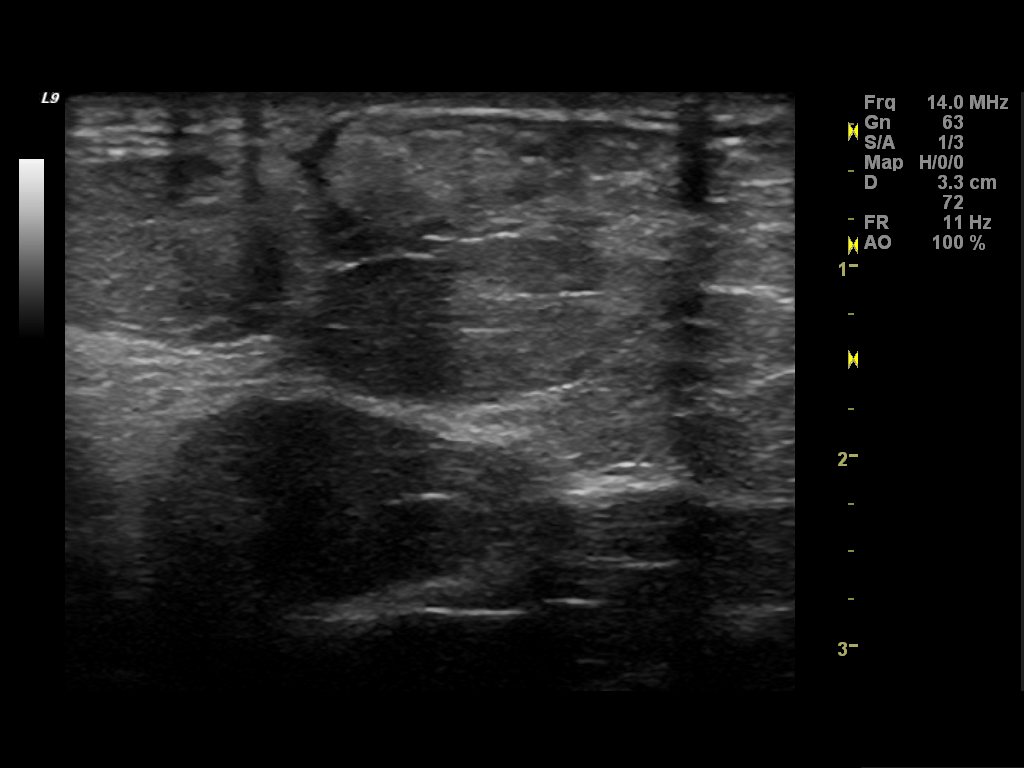
[im 2/6]
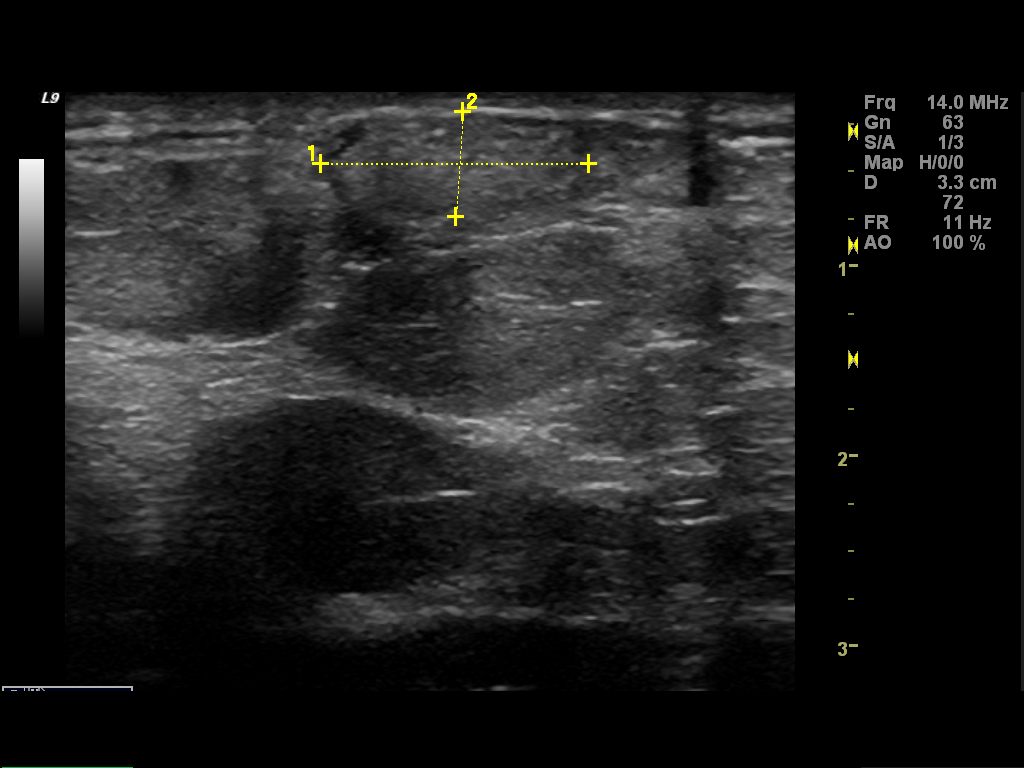
[im 3/6]
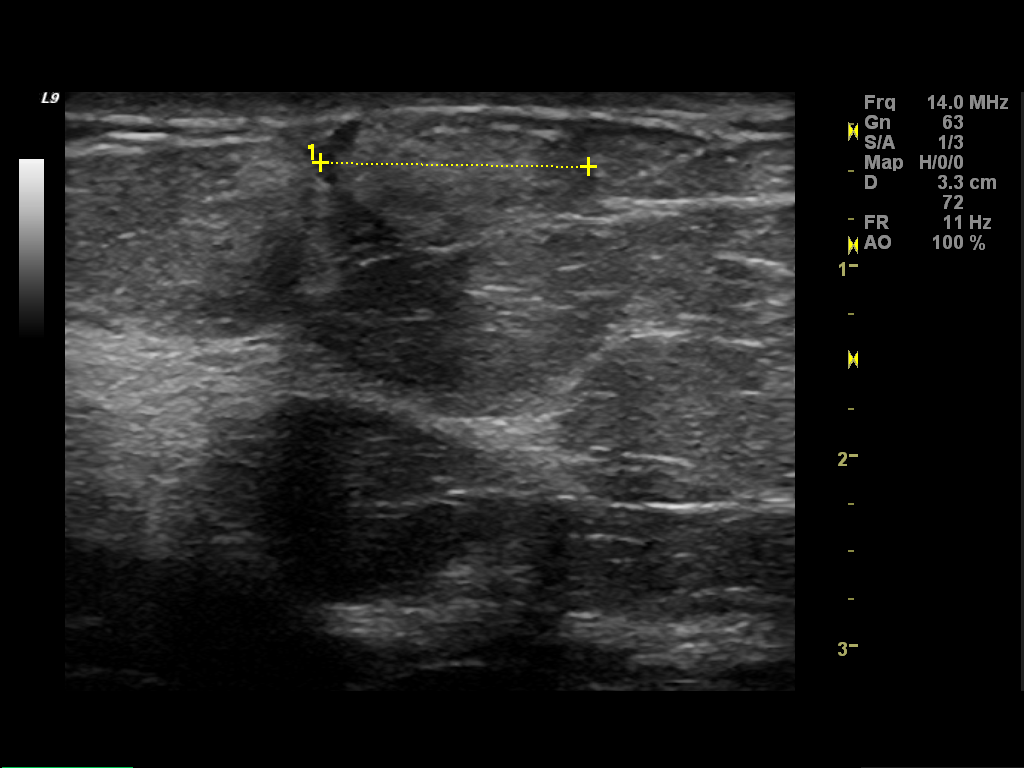
[im 4/6]
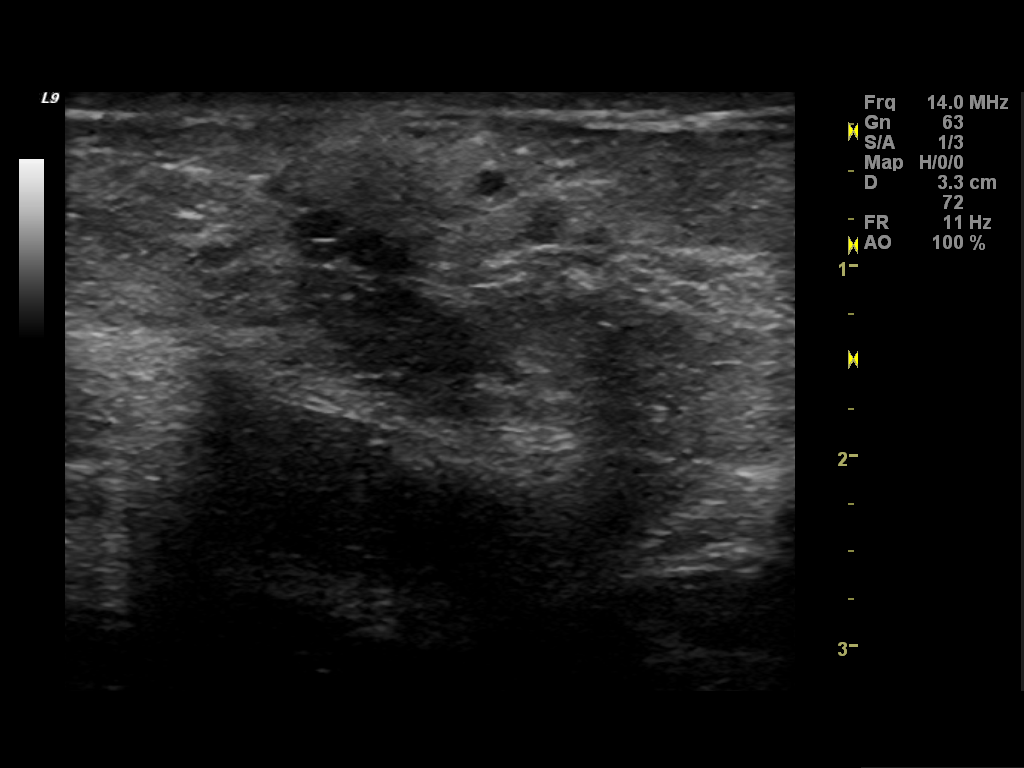
[im 5/6]
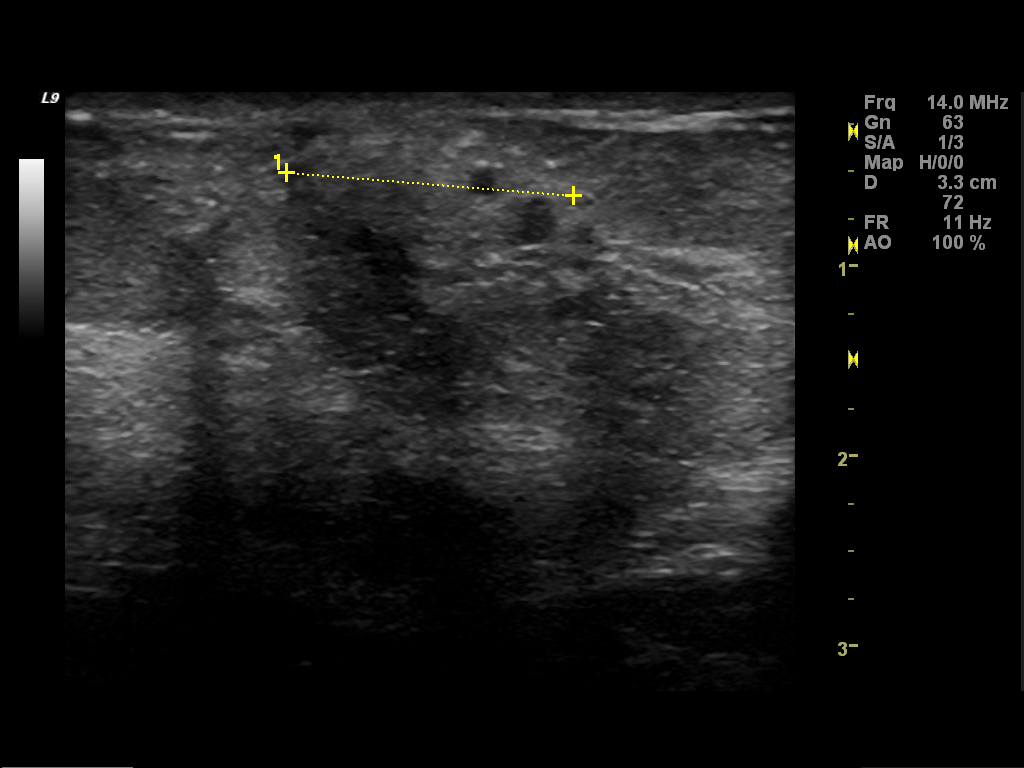
[im 6/6]
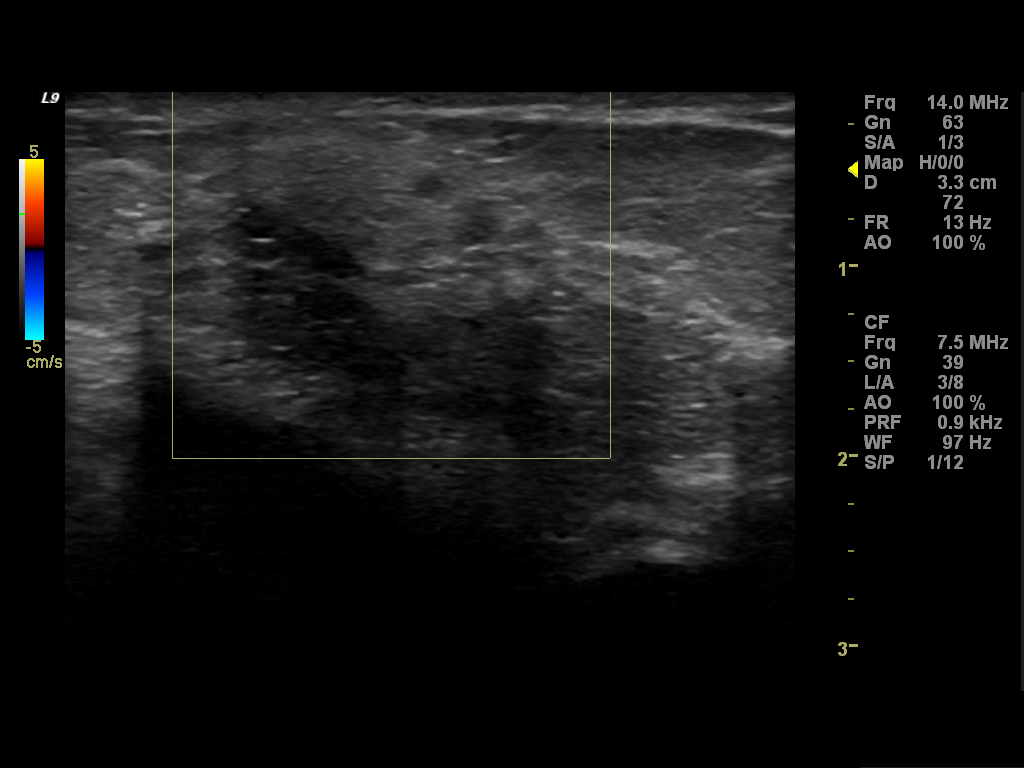

[6 of 6 positions shown; findings below may reference images not displayed]

FINDINGS: There is a scattered fibroglandular parenchymal pattern
present.  On the focally compressed spot tangential view of the
palpable abnormality within the superior left breast there is a
subtle area of questionable distortion.  There is no additional
abnormality.
Mammographic images were processed with CAD.

On physical exam, there is a firm, palpable mass located within the
left breast at the 12 o'clock position 6 cm from the nipple.

Ultrasound is performed, showing a slightly hyperechoic mass
located within the left breast at the 12 o'clock position 6 cm from
the nipple with several small lucencies present.  This measures
x 1.4 x 0.6 cm in size and has mildly increased in size when
compared to the prior study.
IMPRESSION: Mild increase in size of the palpable mass located within the left
breast at the 12 o'clock position with possible distortion seen in
this region on the spot tangential view.  This remains most likely
due to fat necrosis.  However, as this has increased in size  and
there is no clear history of trauma (and there is possible
distortion on the tangential spot views), tissue sampling is
recommended.  I have discussed ultrasound-guided core biopsy of the
area with the patient.  This will be scheduled per patient
preference.

BI-RADS CATEGORY 4:  Suspicious abnormality - biopsy should be
considered.

## 2010-08-08 ENCOUNTER — Other Ambulatory Visit: Payer: Self-pay | Admitting: Obstetrics and Gynecology

## 2010-08-08 ENCOUNTER — Ambulatory Visit
Admission: RE | Admit: 2010-08-08 | Discharge: 2010-08-08 | Disposition: A | Discharge status: Home / Self Care | Payer: BC Managed Care – PPO | Source: Ambulatory Visit | Attending: Obstetrics and Gynecology | Admitting: Obstetrics and Gynecology

## 2010-08-08 ENCOUNTER — Other Ambulatory Visit: Payer: Self-pay | Admitting: Radiology

## 2010-08-08 DIAGNOSIS — N6320 Unspecified lump in the left breast, unspecified quadrant: Secondary | ICD-10-CM

## 2010-08-08 DIAGNOSIS — Z09 Encounter for follow-up examination after completed treatment for conditions other than malignant neoplasm: Secondary | ICD-10-CM

## 2010-08-08 IMAGING — MG MM DIAGNOSTIC UNILATERAL L
2 series · 2 of 2 positions shown · non-contrast
Comparison: Prior studies

CLINICAL DATA: Post left breast ultrasound guided core biopsy.

DIGITAL DIAGNOSTIC LEFT BREAST MAMMOGRAM

[L ML]
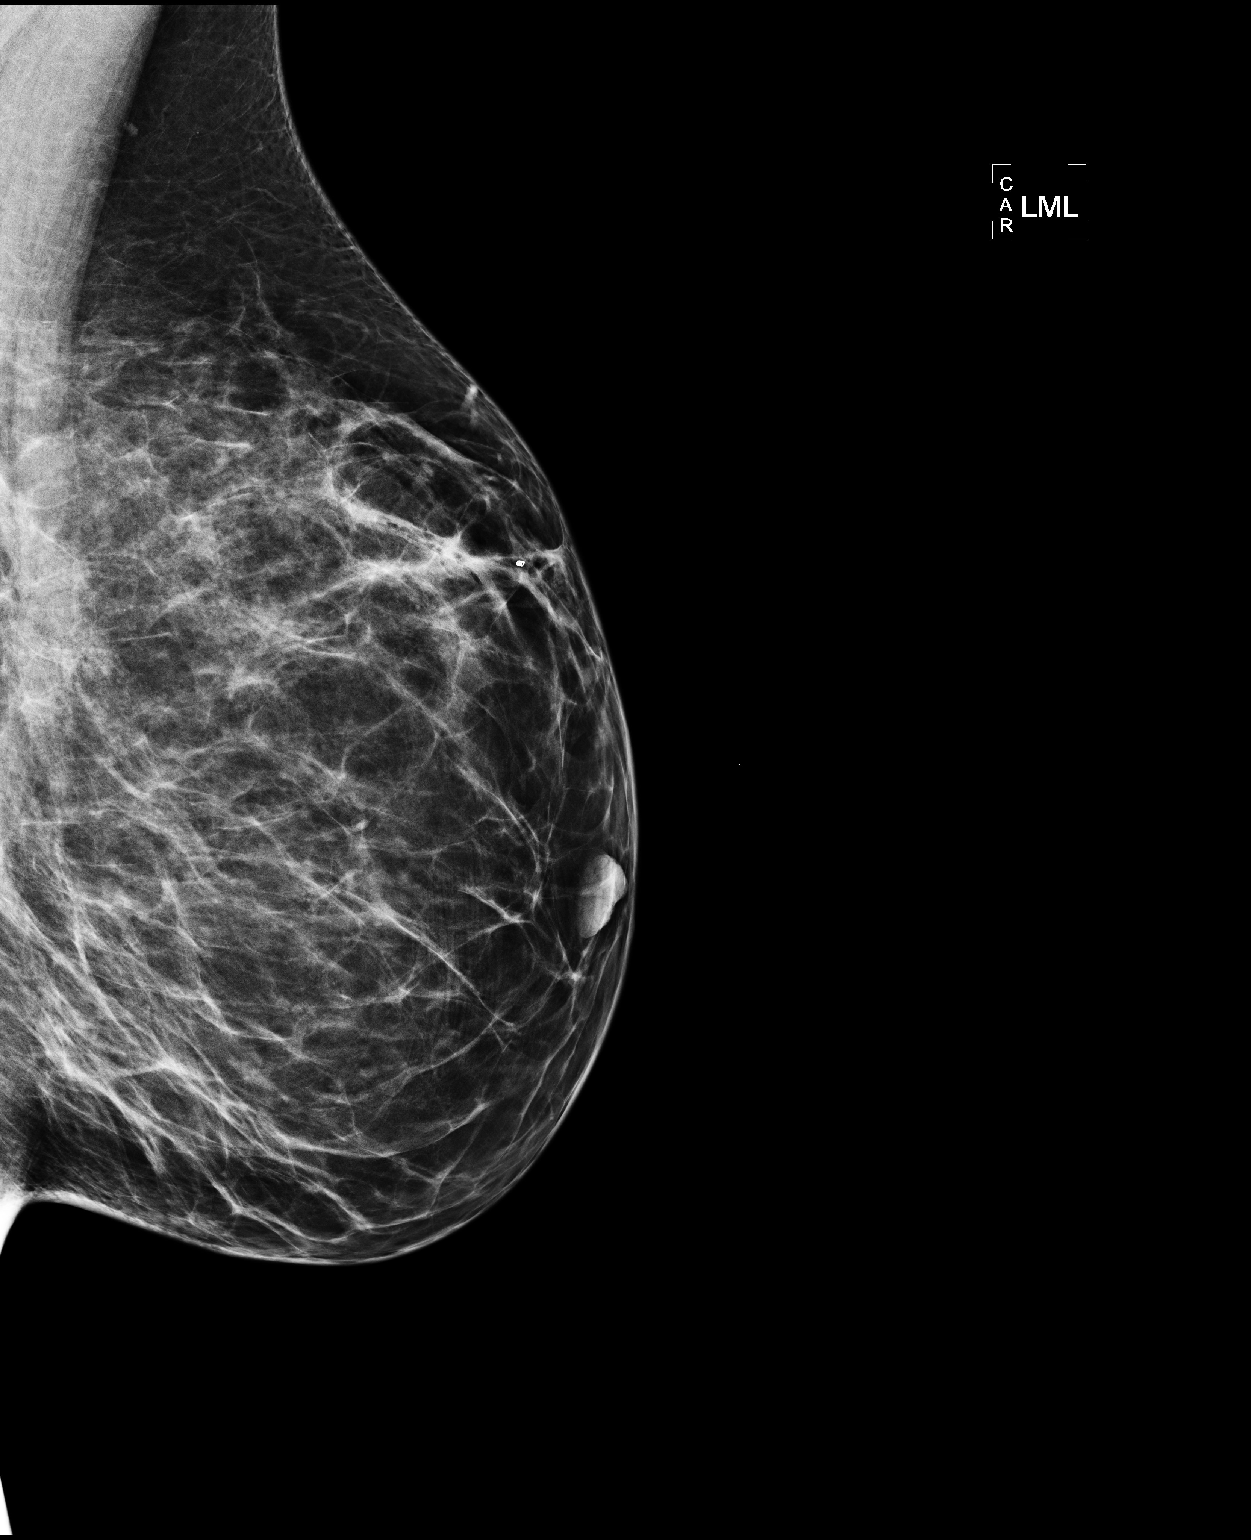

[L CC]
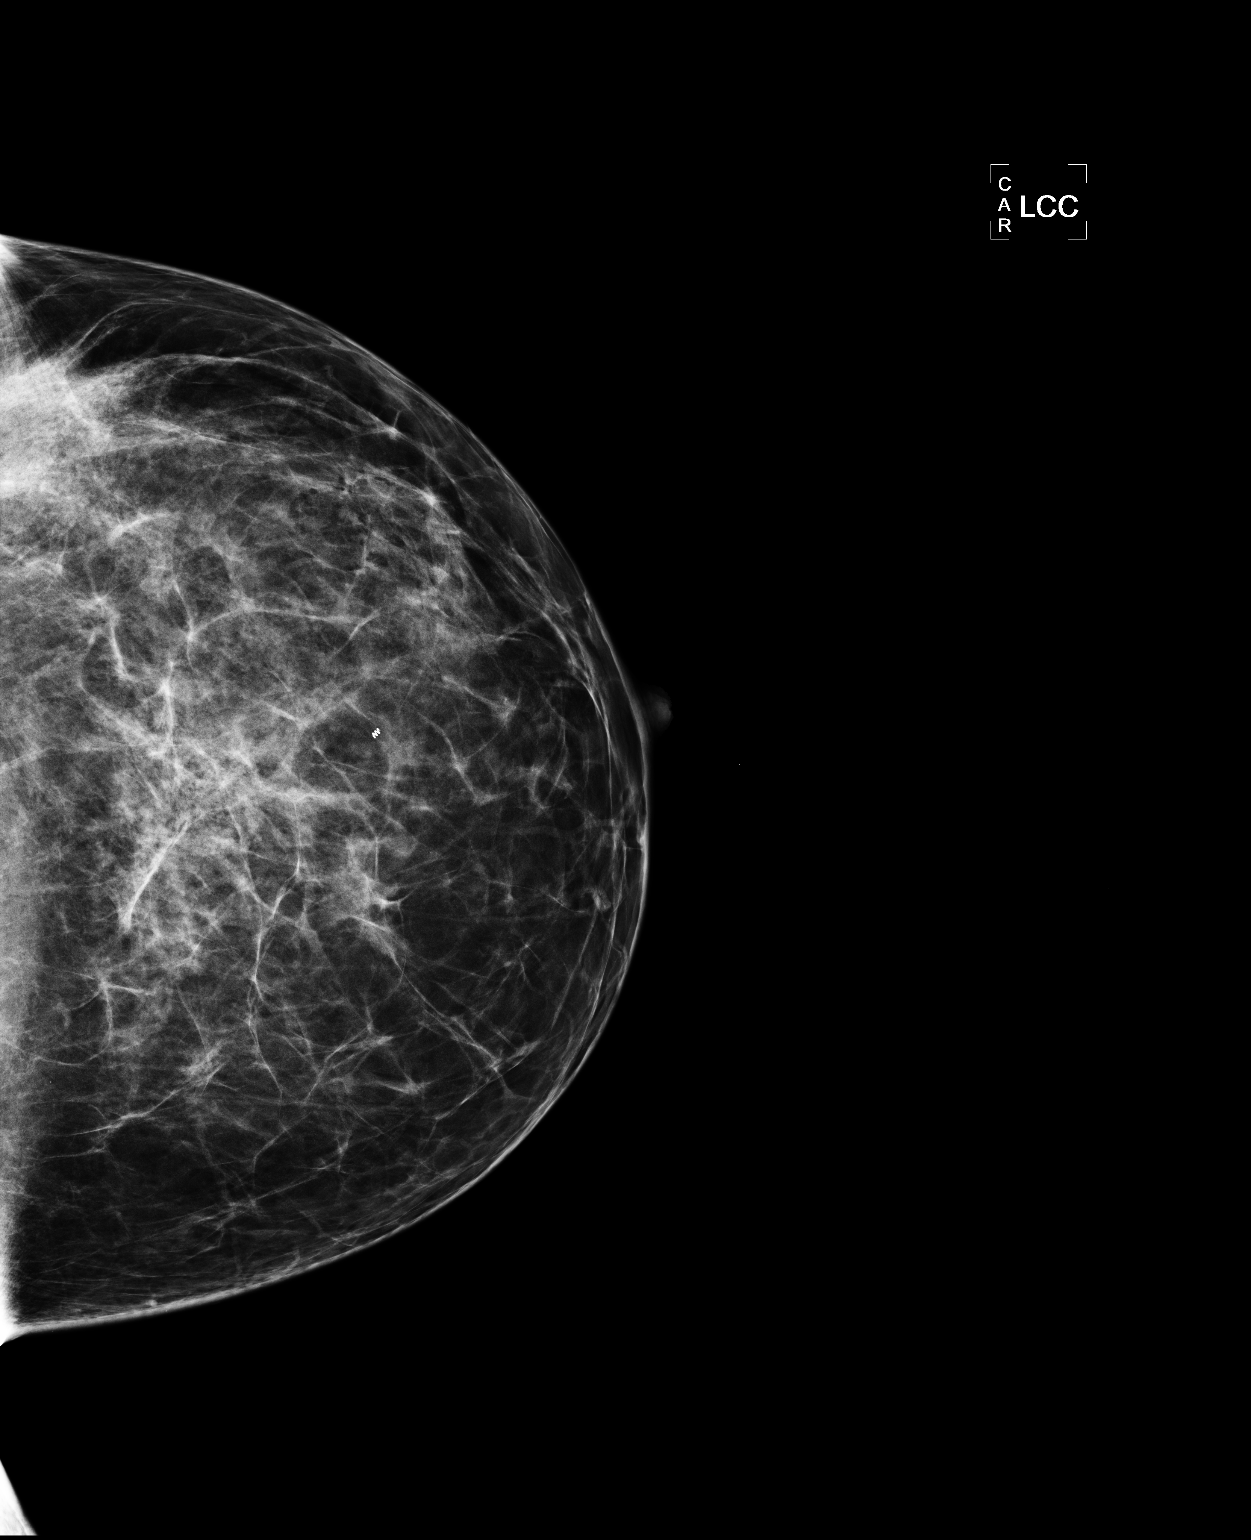

[2 of 2 positions shown; findings below may reference images not displayed]

FINDINGS: Films are performed following ultrasound guided biopsy
of the left breast mass located at the 12 o'clock position.  The
Hydromark clip appears in satisfactory position.
IMPRESSION: Satisfactory position of clip following left breast ultrasound
guided core biopsy.

## 2010-08-08 IMAGING — US US CORE
1 series · 13 of 14 positions shown · non-contrast
Comparison: none

***ADDENDUM*** CREATED: [DATE] [DATE]

Pathology revealed extensive fat necrosis in the left breast. This
was found to be concordant by Dr. YHULI. Pathology was
relayed by telephone. The patient reported minimal tenderness at
the biopsy site. Post biopsy instructions were reviewed and her
questions were answered. She was encouraged to call [facility]
asked to return in 6 months for a left breast ultrasound.
Pathology results are dictated by YHULI RN, BSN on [DATE].
Addended by:  YHULI, M.D. on [DATE] [DATE].
***END ADDENDUM*** SIGNED BY: YHULI, M.D.
CLINICAL DATA: Left breast mass

[Series 1: us core · 13 of 14 slices shown]
[im 1/14]
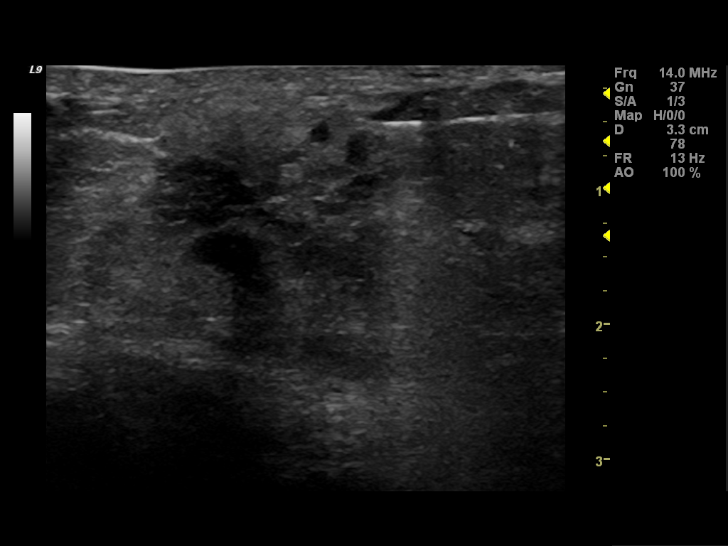
[im 2/14]
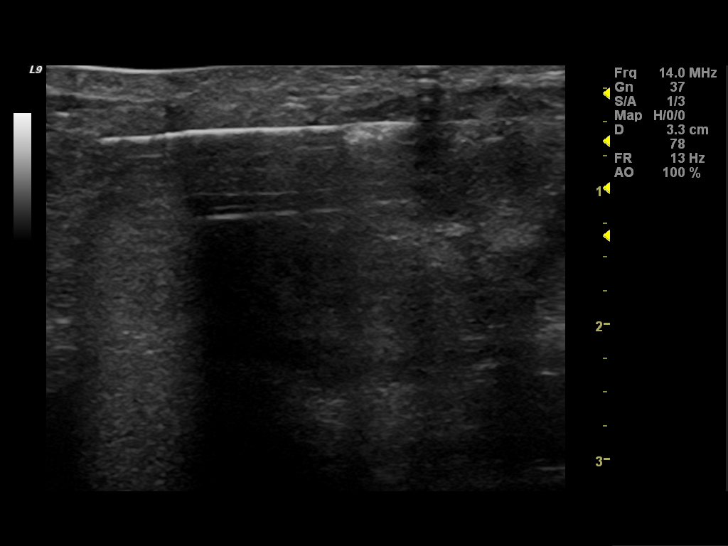
[im 3/14]
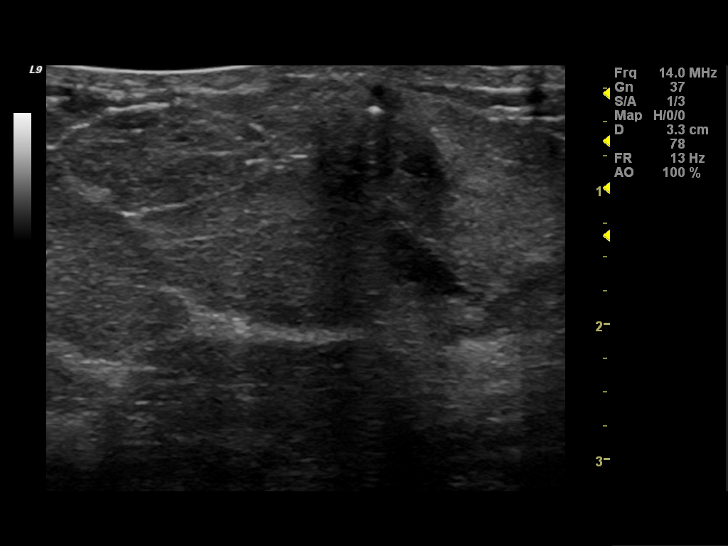
[im 4/14]
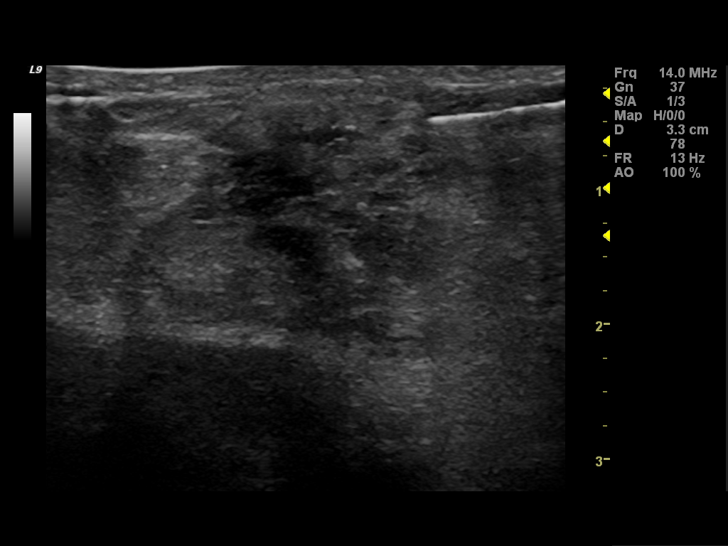
[im 5/14]
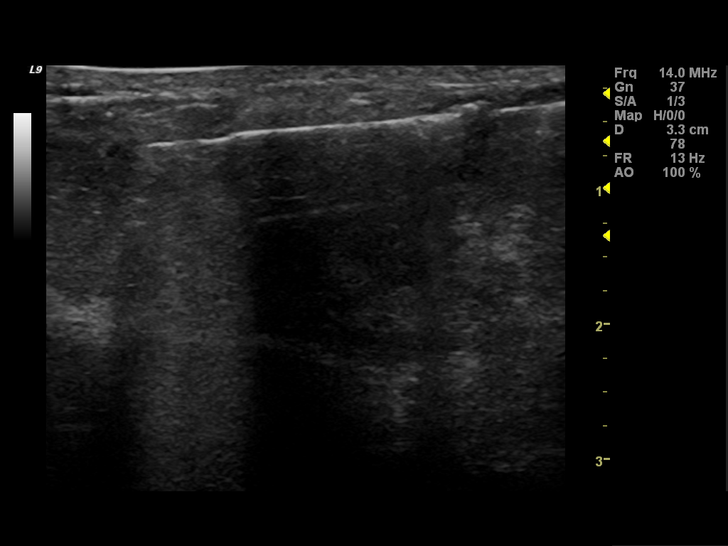
[im 6/14]
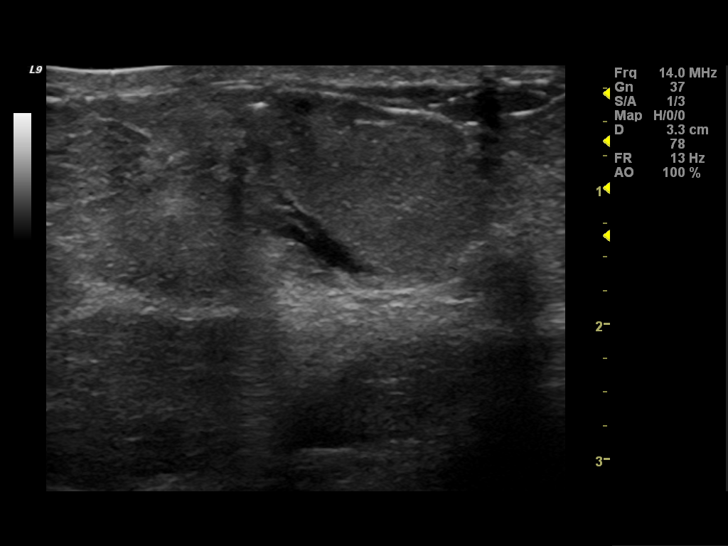
[im 8/14]
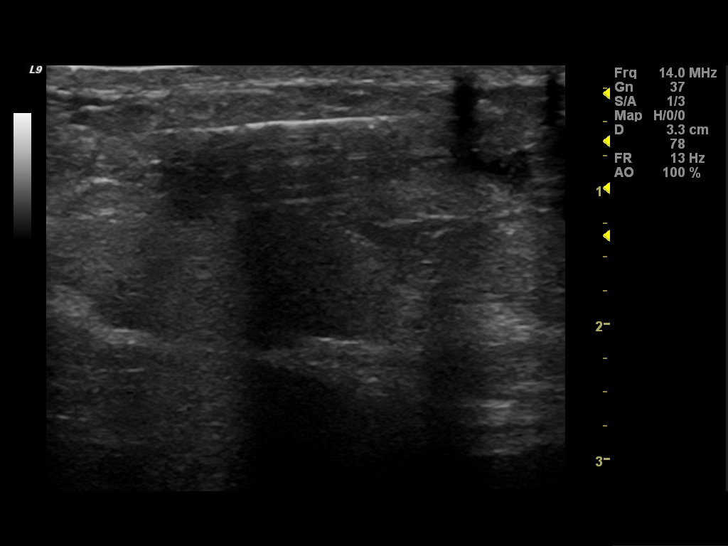
[im 9/14]
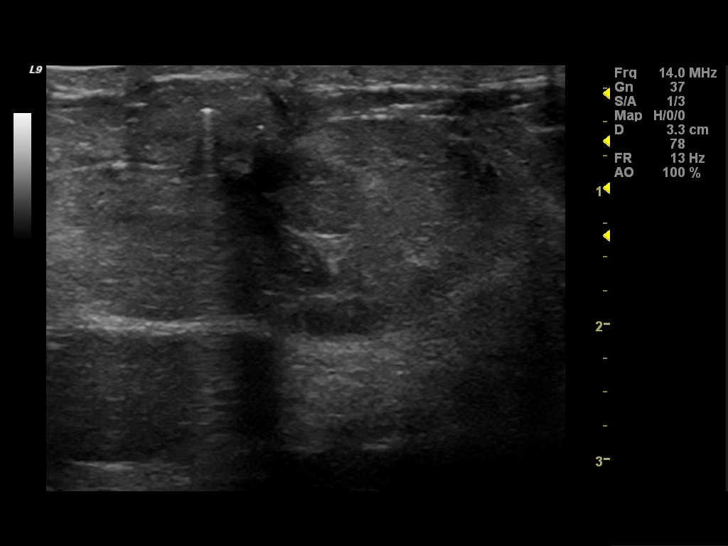
[im 10/14]
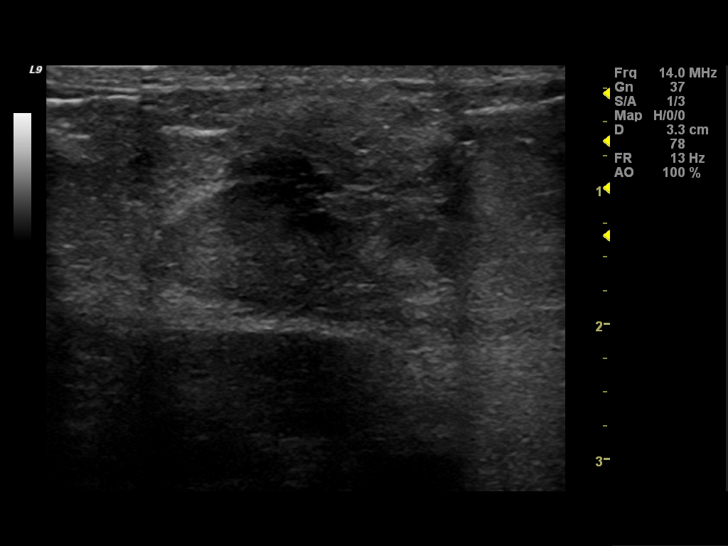
[im 11/14]
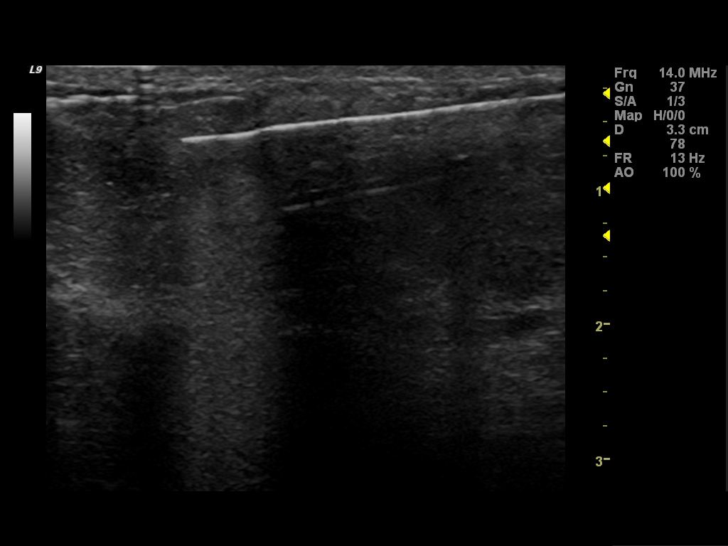
[im 12/14]
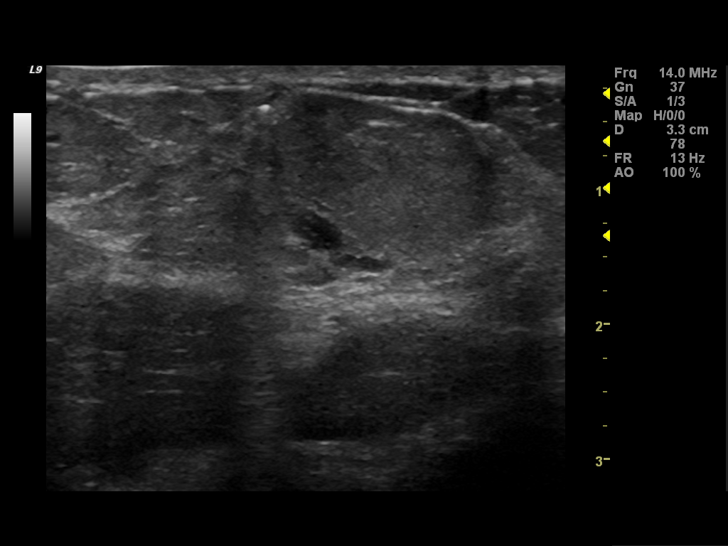
[im 13/14]
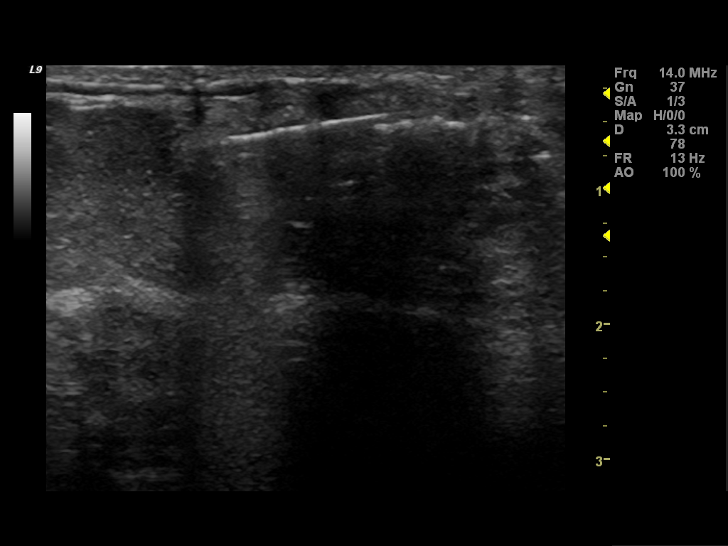
[im 14/14]
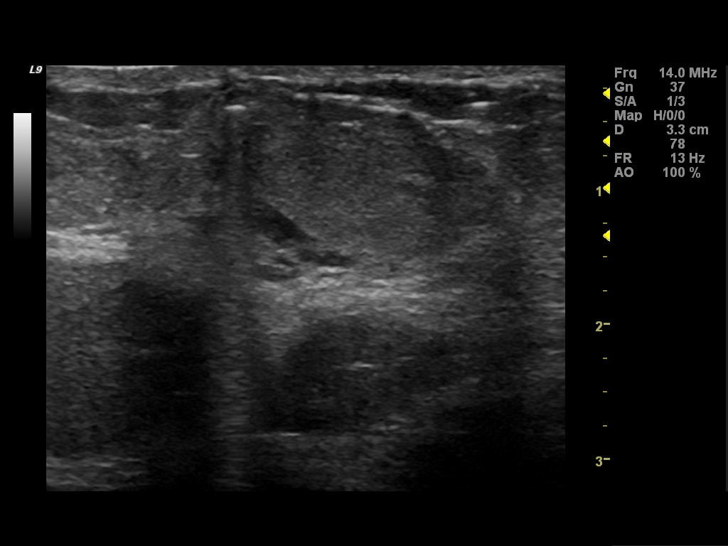

[13 of 14 positions shown; findings below may reference images not displayed]

ULTRASOUND GUIDED CORE BIOPSY OF THE LEFT BREAST

I met with the patient, and we discussed the procedure of
ultrasound-guided biopsy, including risks, benefits, and
alternatives.  Specifically, we discussed the risks of infection,
bleeding, tissue injury, clip migration, and inadequate sampling.
Informed, written consent was given.

Using sterile technique,  2% lidocaine, ultrasound guidance, and a
14 gauge automated biopsy device, biopsy was performed of the
palpable mass located within the left breast at the 12 o'clock
position.  At the conclusion of the procedure, a tissue marker clip
was deployed into the biopsy cavity.  Follow-up 2-view mammogram
was performed and dictated separately.
IMPRESSION: Ultrasound-guided biopsy of the left breast mass located 12 o'clock
position as discussed above.  No apparent complications.

## 2010-08-11 LAB — CBC
HCT: 36.1 % (ref 36.0–46.0)
MCV: 91.3 fL (ref 78.0–100.0)
RBC: 3.96 MIL/uL (ref 3.87–5.11)

## 2010-08-11 LAB — SURGICAL PCR SCREEN
MRSA, PCR: NEGATIVE
Staphylococcus aureus: NEGATIVE

## 2010-08-11 LAB — COMPREHENSIVE METABOLIC PANEL
Albumin: 3.9 g/dL (ref 3.5–5.2)
BUN: 8 mg/dL (ref 6–23)
CO2: 28 mEq/L (ref 19–32)
Calcium: 9.1 mg/dL (ref 8.4–10.5)
Creatinine, Ser: 0.66 mg/dL (ref 0.4–1.2)
Potassium: 3.4 mEq/L — ABNORMAL LOW (ref 3.5–5.1)
Sodium: 137 mEq/L (ref 135–145)
Total Bilirubin: 0.6 mg/dL (ref 0.3–1.2)
Total Protein: 6.7 g/dL (ref 6.0–8.3)

## 2010-09-04 LAB — DIFFERENTIAL
Basophils Absolute: 0 10*3/uL (ref 0.0–0.1)
Eosinophils Relative: 2 % (ref 0–5)
Lymphocytes Relative: 39 % (ref 12–46)
Lymphs Abs: 1.7 10*3/uL (ref 0.7–4.0)
Neutro Abs: 2.1 10*3/uL (ref 1.7–7.7)
Neutrophils Relative %: 50 % (ref 43–77)

## 2010-09-04 LAB — POCT CARDIAC MARKERS
Myoglobin, poc: 66 ng/mL (ref 12–200)
Troponin i, poc: <0.05 ng/mL (ref 0.00–0.09)

## 2010-09-04 LAB — CBC
HCT: 37.9 % (ref 36.0–46.0)
Hemoglobin: 12.9 g/dL (ref 12.0–15.0)
MCHC: 34.1 g/dL (ref 30.0–36.0)
MCV: 91 fL (ref 78.0–100.0)
Platelets: 224 10*3/uL (ref 150–400)
RBC: 4.16 MIL/uL (ref 3.87–5.11)
WBC: 4.3 10*3/uL (ref 4.0–10.5)

## 2010-09-04 LAB — BASIC METABOLIC PANEL
BUN: 13 mg/dL (ref 6–23)
Chloride: 109 mEq/L (ref 96–112)
GFR calc non Af Amer: >60 mL/min (ref >60)
Glucose, Bld: 98 mg/dL (ref 70–99)

## 2010-10-17 NOTE — H&P (Signed)
Dawn Willis, LEVEL                            ACCOUNT NO.:  [id number]   MEDICAL RECORD NO.:  [id number]                   PATIENT TYPE:  OBV   LOCATION:  NA                                   FACILITY:  [facility]   PHYSICIAN:  Guy Sandifer. Arleta Creek, M.D.           DATE OF BIRTH:  June 16, 1975   DATE OF ADMISSION:  DATE OF DISCHARGE:                                HISTORY & PHYSICAL   CHIEF COMPLAINT:  Pelvic pain and painful intercourse.   HISTORY OF PRESENT ILLNESS:  This patient is a 35 year old married white  female, G2, P2, status post tubal ligation, who is status post laparoscopy  at least four or five times in the past for treatment of endometriosis.  She  has had recurrent dysmenorrhea which is becoming progressively worse.  She  has also had recurrent prolonged bleeding.  She bled for 12 days in August.  She is currently on Depo-Provera which has been of no help.  She also had  increasingly severe pain with intercourse at the vaginal vestibule.  Careful  inspection of this area reveals a U-shaped distribution distal to the  hymenal caruncles that is quite tender.  Her Pap smear is benign.  Herpes  cultures are negative.  Biopsy of the vaginal introitus is consistent with  mild hyperplasia and mild chronic inflammatory infiltrate.  Urine culture is  also negative.  After discussion of the above and discussion of the options  she is being admitted for laparoscopically assisted vaginal hysterectomy and  removal of one ovary only if distinctly abnormal and vulvar vestibulectomy.   PAST MEDICAL HISTORY:  1. Degenerative disk changes at the S1 location.  2. Endometriosis as above.   PAST SURGICAL HISTORY:  Laparoscopy x4 as above.   OBSTETRIC HISTORY:  Vaginal delivery x2.   MEDICATIONS:  1. Depo-Provera.  2. Herbal supplements.   ALLERGIES:  SULFA.   FAMILY HISTORY:  Multiple gestations.  Heart disease in mother.   REVIEW OF SYSTEMS:  NEUROLOGIC:  Denies headache.   CARDIOVASCULAR:  Denies  chest pain.  PULMONARY:  Denies shortness of breath.  MUSCULOSKELETAL:  Back  pain as above.  GASTROINTESTINAL:  No recent changes in bowel habits.  GENITOURINARY:  No recent changes in bladder habits.  Vulvar pain as above.   PHYSICAL EXAMINATION:  VITAL SIGNS:  Height 5 feet 4 inches, weight 136  pounds, blood pressure 110/68.  HEENT:  Without thyromegaly.  LUNGS:  Clear to auscultation.  HEART:  Regular rate and rhythm.  BACK:  Without CVA tenderness.  BREASTS:  Without mass, traction, discharge.  ABDOMEN:  Soft, nontender, without masses.  PELVIC:  Vulva, vagina, cervix without lesions.  She does have point  tenderness as described above.  Uterus is anteverted, slightly deviated to  the left.  Left adnexa mildly tender without palpable masses.  Right adnexa  nontender without masses.  NEUROLOGIC/MUSCULOSKELETAL:  Exam within normal limits.  ASSESSMENT:  1. Endometriosis with recurrent pain.  2. Menometrorrhagia.  3. Vulvar vestibuli.   PLAN:  Microscopically assisted vaginal hysterectomy, possible unilateral  salpingo-oophorectomy and vulvar vestibulectomy.                                                   Guy Sandifer Arleta Creek, M.D.    JET/MEDQ  D:  02/26/2003  T:  02/26/2003  Job:  [id number]

## 2010-10-17 NOTE — Op Note (Signed)
Dawn Willis, MOZINGO                            ACCOUNT NO.:  [id number]   MEDICAL RECORD NO.:  [id number]                   PATIENT TYPE:  OBV   LOCATION:  9399                                 FACILITY:  [facility]   PHYSICIAN:  Guy Sandifer. Arleta Creek, M.D.           DATE OF BIRTH:  06-Oct-1975   DATE OF PROCEDURE:  03/05/2003  DATE OF DISCHARGE:                                 OPERATIVE REPORT   PREOPERATIVE DIAGNOSES:  1. Pelvic pain.  2. Menometrorrhagia.  3. Vulvar vestibulitis.   POSTOPERATIVE DIAGNOSES:  1. Pelvic pain.  2. Menometrorrhagia.  3. Vulvar vestibulitis.   PROCEDURES:  1. Laparoscopically-assisted vaginal hysterectomy.  2. Vulvar vestibulectomy.   SURGEON:  Guy Sandifer. [name], M.D.   ASSISTANT:  Dineen Kid. Rana Snare, M.D.   ANESTHESIA:  General with endotracheal intubation.   ESTIMATED BLOOD LOSS:  150 mL.   SPECIMENS:  1. Uterus.  2. Vulvar vestibular mucosa.   INDICATIONS AND CONSENT:  This patient is a 35 year old married white  female, G2, P2, status post tubal ligation, with symptoms as detailed in the  history and physical.  Laparoscopically-assisted vaginal hysterectomy,  possible removal of one tube or ovary if distinctly abnormal, and partial  vulvar vestibulectomy has been discussed with the patient.  Potential risks  and complications have been discussed, including but not limited to  infection, bowel, bladder, ureteral damage, bleeding requiring transfusion  of blood products with possible transfusion, HIV and hepatitis acquisition,  DVT, PE, pneumonia, possible laparotomy.  Possible recurrence of vulvar pain  and dyspareunia has also been discussed in full.  All questions have been  answered and consent is signed on the chart.   FINDINGS:  Upper abdomen is grossly normal.  Uterus is about six weeks in  size, smooth in contour.  Anterior and posterior cul-de-sac are normal.  Tubes are status post ligation bilaterally, and ovaries are normal.  There  are  prominent pelvic veins leading to the uterus.   DESCRIPTION OF PROCEDURE:  The patient was taken to operating room #2,  identified, and placed in the dorsal supine position.  She is then placed in  the dorsal lithotomy position and her legs are raised to test for pain in  her lower back.  The point of painful flexion is noted.  The legs were then  lowered back down in the stirrups.  General anesthesia via endotracheal  intubation is then carried out.  The patient is prepped abdominally and  vaginally, straight-catheterized, a Hulka tenaculum is placed in the uterus  as a manipulator, and she is draped in a sterile fashion.  Two Allis clamps  are used to grasp the skin at the inferior margin of the umbilicus and an  incision is made.  Dissection is carried out in layers and the peritoneum is  entered bluntly.  Careful inspection and palpation reveals no adhesions.  The fascia is anchored with 0 Vicryl on  the way in.  The disposable open  laparoscopic trocar sleeve is then placed and pneumoperitoneum is induced.  A small suprapubic incision is made and a 5 mm disposable trocar sleeve is  placed under direct visualization without difficulty.  The above findings  are noted.  The course of the ureters is noted bilaterally.  Then using the  Gyrus bipolar cautery cutting instrument, the utero-ovarian ligament  followed by the round ligament are taken down, and this is carried out down  to the level of the vesicouterine peritoneum bilaterally.  Good hemostasis  is maintained.  The vesicouterine peritoneum is incised in the midline,  hydrodissected, and taken down cephalolaterally.  The suprapubic trocar  sleeve is removed and attention is turned to the vagina.  A weighted  speculum is placed.  The posterior cul-de-sac is entered sharply without  difficulty.  The cervix is circumscribed with a scalpel.  The mucosa is  advanced sharply and bluntly.  The uterosacral ligaments are then taken down   bilaterally and ligated with transfixion sutures of 0 Monocryl.  All suture  will be 0 Monocryl unless otherwise designated.  Bladder pillars followed  the cardinal ligaments, followed by the uterine vessels, are taken down  bilaterally.  A single bite above the level of the uterine vessels is taken  bilaterally.  The fundus is delivered posteriorly.  Proximal ligaments are  taken down.  The specimen is delivered.  The pedicles are ligated with free  ties.  The uterosacral ligaments are then plicated to the vaginal mucosa  bilaterally.  Uterosacral ligaments are then plicated in the midline with a  separate suture.  The cuff is closed with figure-of-eight sutures.  Good  hemostasis is noted.  A Foley catheter is placed and clear urine is noted.  Vulvar vestibulectomy is then carried out by infiltrating the mucosa of the  vulvar vestibule with 0.5% lidocaine with epinephrine.  Then a U-shaped area  of the mucosa is taken extending from a level just inferior to the urethral  meatus bilaterally with the distal margin at the margin of the mucosa and  the proximal margin just inside the level of the hymenal caruncles.  This is  then resected in a superficial fashion.  Bleeders are controlled with  cautery.  Minimal undermining is required to obtain good mobility to  approximate the edges.  Vicryl 4-0 interrupteds are then used to approximate  the mucosa to the skin edge.  Good hemostasis is obtained.  Attention is  then returned to the abdomen.  Pneumoperitoneum is reintroduced and the 5 mm  disposable trocar sleeve is then reintroduced under direct visualization  without difficulty.  Minor bleeding at the edges is controlled with bipolar  cautery.  Repeated inspection under reduced pneumoperitoneum is carried out  and good hemostasis is noted.  Excess fluid is removed.  The suprapubic  trocar sleeve is removed.  Pneumoperitoneum is reduced.  The umbilical trocar sleeve is removed.  The fascia of  the umbilical incision is then  closed with the 0 Vicryl sutures.  Great care is taken not to pick up any  underlying structures.  The skin edges in the umbilical incision are  somewhat oozy.  They are closed with interrupted 2-0 Vicryl mattress  sutures.  A single mattress suture is placed in the suprapubic incision.  The incisions are then infiltrated with 0.5% plain Marcaine.  Dermabond is  applied.  A dressing is applied to the umbilical incision.  All counts  correct.  The  patient is awakened and taken to the recovery room in stable  condition.                                               Guy Sandifer Arleta Creek, M.D.    JET/MEDQ  D:  03/05/2003  T:  03/05/2003  Job:  [id number]

## 2013-01-24 ENCOUNTER — Ambulatory Visit (HOSPITAL_COMMUNITY): Payer: BC Managed Care – PPO | Admitting: Licensed Clinical Social Worker

## 2013-01-31 ENCOUNTER — Ambulatory Visit (INDEPENDENT_AMBULATORY_CARE_PROVIDER_SITE_OTHER): Payer: BC Managed Care – PPO | Admitting: Psychiatry

## 2013-01-31 ENCOUNTER — Ambulatory Visit (HOSPITAL_COMMUNITY): Payer: BC Managed Care – PPO | Admitting: Psychiatry

## 2013-01-31 ENCOUNTER — Encounter (HOSPITAL_COMMUNITY): Payer: Self-pay | Admitting: Psychiatry

## 2013-01-31 VITALS — BP 112/78 | Ht 65.0 in | Wt 176.0 lb

## 2013-01-31 DIAGNOSIS — F4310 Post-traumatic stress disorder, unspecified: Secondary | ICD-10-CM

## 2013-01-31 MED ORDER — ZALEPLON 10 MG PO CAPS: 10.0000 mg | ORAL_CAPSULE | Freq: Every day | ORAL | Status: DC

## 2013-01-31 MED ORDER — LITHIUM CARBONATE 300 MG PO CAPS: ORAL_CAPSULE | ORAL | Status: DC

## 2013-01-31 MED ORDER — DOXEPIN HCL 50 MG PO CAPS: 150.0000 mg | ORAL_CAPSULE | Freq: Every day | ORAL | Status: DC
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
## 2013-03-03 ENCOUNTER — Ambulatory Visit (INDEPENDENT_AMBULATORY_CARE_PROVIDER_SITE_OTHER): Payer: BC Managed Care – PPO | Admitting: Psychiatry

## 2013-03-03 ENCOUNTER — Encounter (HOSPITAL_COMMUNITY): Payer: Self-pay | Admitting: Psychiatry

## 2013-03-03 VITALS — BP 108/72 | Ht 65.0 in | Wt 176.0 lb

## 2013-03-03 DIAGNOSIS — F4310 Post-traumatic stress disorder, unspecified: Secondary | ICD-10-CM

## 2013-03-03 MED ORDER — DOXEPIN HCL 50 MG PO CAPS: 150.0000 mg | ORAL_CAPSULE | Freq: Every day | ORAL | Status: DC

## 2013-03-03 MED ORDER — LITHIUM CARBONATE 300 MG PO CAPS: ORAL_CAPSULE | ORAL | Status: DC

## 2013-03-03 MED ORDER — ZALEPLON 10 MG PO CAPS: 10.0000 mg | ORAL_CAPSULE | Freq: Every day | ORAL | Status: DC
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
## 2013-04-03 ENCOUNTER — Other Ambulatory Visit (HOSPITAL_COMMUNITY): Payer: Self-pay | Admitting: Psychiatry

## 2013-04-04 NOTE — Telephone Encounter (Signed)
Called pharmacy. She now has a 30 day supply of Doxepin, lithium and Sonata.  Called patient, informed her that she would have to call the clinic for an appointment and that a 30 day supply of her medications would be needed.

## 2013-05-01 ENCOUNTER — Other Ambulatory Visit (HOSPITAL_COMMUNITY): Payer: Self-pay | Admitting: Psychiatry

## 2013-05-03 ENCOUNTER — Telehealth (HOSPITAL_COMMUNITY): Payer: Self-pay

## 2013-05-03 MED ORDER — DOXEPIN HCL 50 MG PO CAPS: ORAL_CAPSULE | ORAL | Status: DC

## 2013-05-03 MED ORDER — ZALEPLON 10 MG PO CAPS: ORAL_CAPSULE | ORAL | Status: DC

## 2013-05-03 MED ORDER — LITHIUM CARBONATE 300 MG PO CAPS: ORAL_CAPSULE | ORAL | Status: DC

## 2013-05-03 NOTE — Telephone Encounter (Signed)
Refilled one month supply of medications for patient.

## 2013-05-04 ENCOUNTER — Other Ambulatory Visit (HOSPITAL_COMMUNITY): Payer: Self-pay | Admitting: Psychiatry

## 2013-05-05 ENCOUNTER — Telehealth (HOSPITAL_COMMUNITY): Payer: Self-pay | Admitting: Psychiatry

## 2013-05-05 NOTE — Telephone Encounter (Signed)
Clarified prescriptions.

## 2013-05-05 NOTE — Telephone Encounter (Signed)
Called pharmacy. They have resent

## 2013-06-05 ENCOUNTER — Ambulatory Visit (INDEPENDENT_AMBULATORY_CARE_PROVIDER_SITE_OTHER): Payer: BC Managed Care – PPO | Admitting: Psychiatry

## 2013-06-05 ENCOUNTER — Ambulatory Visit (HOSPITAL_COMMUNITY): Payer: Self-pay | Admitting: Psychiatry

## 2013-06-05 ENCOUNTER — Encounter (HOSPITAL_COMMUNITY): Payer: Self-pay | Admitting: Psychiatry

## 2013-06-05 VITALS — BP 114/75 | HR 95 | Wt 172.0 lb

## 2013-06-05 DIAGNOSIS — F4310 Post-traumatic stress disorder, unspecified: Secondary | ICD-10-CM

## 2013-06-05 LAB — CREATININE, SERUM: Creat: 0.69 mg/dL (ref 0.50–1.10)

## 2013-06-05 LAB — BUN: BUN: 7 mg/dL (ref 6–23)

## 2013-06-05 MED ORDER — DOXEPIN HCL 50 MG PO CAPS: ORAL_CAPSULE | ORAL | Status: DC

## 2013-06-05 MED ORDER — ZALEPLON 10 MG PO CAPS: ORAL_CAPSULE | ORAL | Status: DC

## 2013-06-05 MED ORDER — LITHIUM CARBONATE 300 MG PO CAPS: 600.0000 mg | ORAL_CAPSULE | Freq: Every day | ORAL | Status: DC
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
## 2013-06-06 ENCOUNTER — Other Ambulatory Visit (HOSPITAL_COMMUNITY): Payer: Self-pay | Admitting: Psychiatry

## 2013-06-06 LAB — TSH: TSH: 2.363 u[IU]/mL (ref 0.350–4.500)

## 2013-06-06 LAB — LITHIUM LEVEL: Lithium Lvl: 0.4 mEq/L — ABNORMAL LOW (ref 0.80–1.40)

## 2013-06-06 MED ORDER — DOXEPIN HCL 50 MG PO CAPS: ORAL_CAPSULE | ORAL | Status: DC

## 2013-06-06 NOTE — Telephone Encounter (Signed)
Refilled doxepin

## 2013-07-03 ENCOUNTER — Encounter (HOSPITAL_COMMUNITY): Payer: Self-pay | Admitting: Psychiatry

## 2013-07-03 ENCOUNTER — Ambulatory Visit (INDEPENDENT_AMBULATORY_CARE_PROVIDER_SITE_OTHER): Payer: BC Managed Care – PPO | Admitting: Psychiatry

## 2013-07-03 VITALS — BP 123/89 | HR 113 | Wt 166.0 lb

## 2013-07-03 DIAGNOSIS — F4310 Post-traumatic stress disorder, unspecified: Secondary | ICD-10-CM

## 2013-07-03 MED ORDER — LITHIUM CARBONATE 300 MG PO CAPS: 300.0000 mg | ORAL_CAPSULE | Freq: Two times a day (BID) | ORAL | Status: DC

## 2013-07-03 MED ORDER — DOXEPIN HCL 150 MG PO CAPS: 150.0000 mg | ORAL_CAPSULE | Freq: Every day | ORAL | Status: DC

## 2013-07-03 MED ORDER — ZALEPLON 5 MG PO CAPS: 5.0000 mg | ORAL_CAPSULE | Freq: Every day | ORAL | Status: DC
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
## 2013-07-24 ENCOUNTER — Telehealth (HOSPITAL_COMMUNITY): Payer: Self-pay

## 2013-07-28 MED ORDER — RISPERIDONE 0.25 MG PO TBDP: ORAL_TABLET | ORAL | Status: DC

## 2013-07-28 NOTE — Telephone Encounter (Signed)
The patient reports that she continues to have paranoia which keeps her from sleeping. Will start patient on Risperidone 0.25 mg and titrate to 1 mg to start on 07/31/2013.

## 2013-08-01 ENCOUNTER — Telehealth (HOSPITAL_COMMUNITY): Payer: Self-pay

## 2013-08-01 NOTE — Telephone Encounter (Signed)
Not sleeping. Will increase dose to 0.75 to 1 mg QHS.

## 2013-08-16 ENCOUNTER — Ambulatory Visit (HOSPITAL_COMMUNITY): Payer: Self-pay | Admitting: Psychiatry

## 2014-01-07 ENCOUNTER — Emergency Department: Payer: Self-pay | Admitting: Emergency Medicine

## 2014-01-07 LAB — URINALYSIS, COMPLETE
BILIRUBIN, UR: NEGATIVE
Blood: NEGATIVE
GLUCOSE, UR: NEGATIVE mg/dL (ref 0–75)
LEUKOCYTE ESTERASE: NEGATIVE
Nitrite: NEGATIVE
Ph: 5 (ref 4.5–8.0)
Protein: 30
SPECIFIC GRAVITY: 1.029 (ref 1.003–1.030)
WBC UR: 2 /HPF (ref 0–5)

## 2014-01-07 LAB — COMPREHENSIVE METABOLIC PANEL
ALBUMIN: 4.1 g/dL (ref 3.4–5.0)
ALK PHOS: 65 U/L
AST: 18 U/L (ref 15–37)
Anion Gap: 9 (ref 7–16)
BILIRUBIN TOTAL: 0.5 mg/dL (ref 0.2–1.0)
BUN: 9 mg/dL (ref 7–18)
CALCIUM: 9.2 mg/dL (ref 8.5–10.1)
CHLORIDE: 107 mmol/L (ref 98–107)
CO2: 23 mmol/L (ref 21–32)
CREATININE: 0.82 mg/dL (ref 0.60–1.30)
EGFR (African American): >60
EGFR (Non-African Amer.): >60
Glucose: 99 mg/dL (ref 65–99)
OSMOLALITY: 276 (ref 275–301)
POTASSIUM: 4.1 mmol/L (ref 3.5–5.1)
SGPT (ALT): 18 U/L
Sodium: 139 mmol/L (ref 136–145)
TOTAL PROTEIN: 8.4 g/dL — AB (ref 6.4–8.2)

## 2014-01-07 LAB — CBC WITH DIFFERENTIAL/PLATELET
BASOS ABS: 0 10*3/uL (ref 0.0–0.1)
BASOS PCT: 0.4 %
EOS ABS: 0 10*3/uL (ref 0.0–0.7)
Eosinophil %: 0.1 %
HCT: 41.1 % (ref 35.0–47.0)
HGB: 13.9 g/dL (ref 12.0–16.0)
Lymphocyte #: 1.8 10*3/uL (ref 1.0–3.6)
Lymphocyte %: 22 %
MCH: 30.7 pg (ref 26.0–34.0)
MCHC: 33.7 g/dL (ref 32.0–36.0)
MCV: 91 fL (ref 80–100)
MONO ABS: 0.4 x10 3/mm (ref 0.2–0.9)
MONOS PCT: 5.2 %
NEUTROS ABS: 5.9 10*3/uL (ref 1.4–6.5)
NEUTROS PCT: 72.3 %
Platelet: 293 10*3/uL (ref 150–440)
RBC: 4.52 10*6/uL (ref 3.80–5.20)
RDW: 13.3 % (ref 11.5–14.5)
WBC: 8.2 10*3/uL (ref 3.6–11.0)

## 2014-01-07 LAB — LIPASE, BLOOD: Lipase: 72 U/L — ABNORMAL LOW (ref 73–393)

## 2014-01-07 IMAGING — US ABDOMEN ULTRASOUND LIMITED
1 series · 14 of 25 positions shown · non-contrast
Comparison: Abdominal ultrasound on [DATE]

CLINICAL DATA: Right upper quadrant abdominal pain.

EXAM:
US ABDOMEN LIMITED - RIGHT UPPER QUADRANT

[Series 1: abdomen ultrasound limited · 0.24mm/px · 14 of 55 slices shown]
[im 1/55]
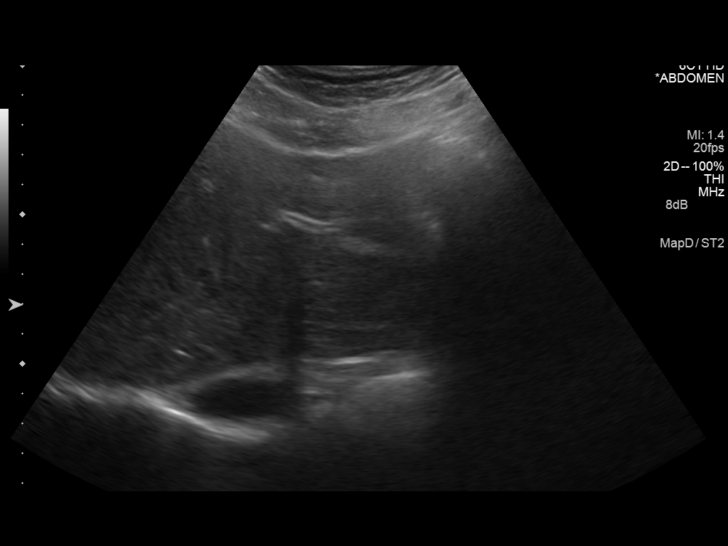
[im 5/55]
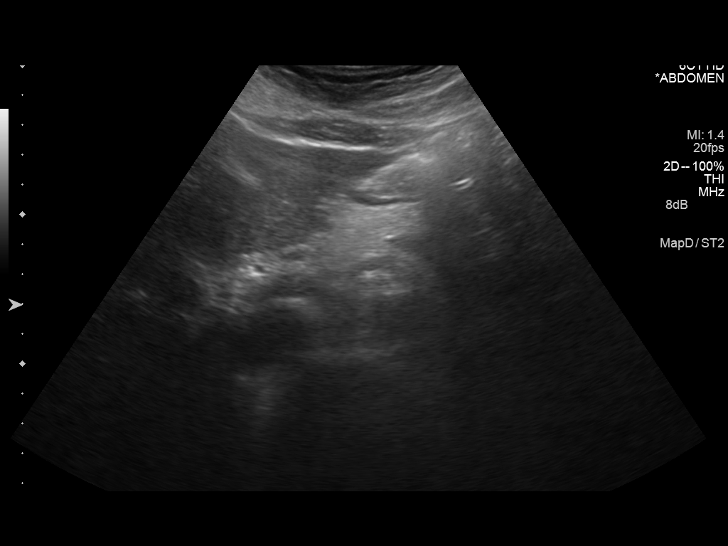
[im 10/55]
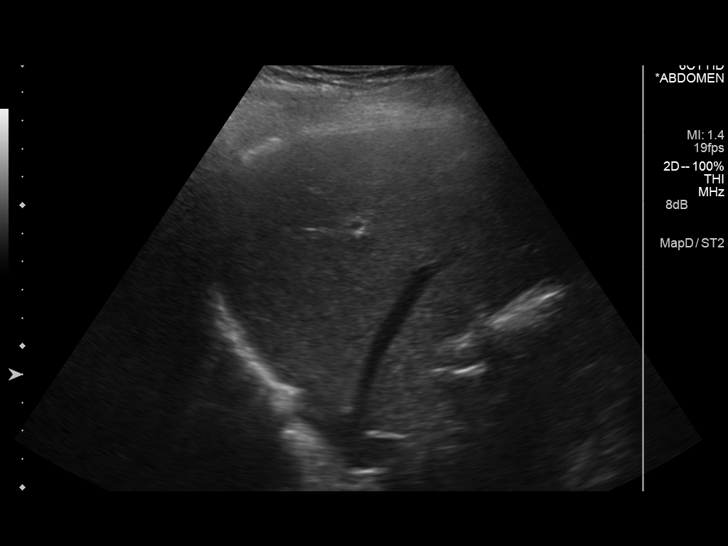
[im 14/55]
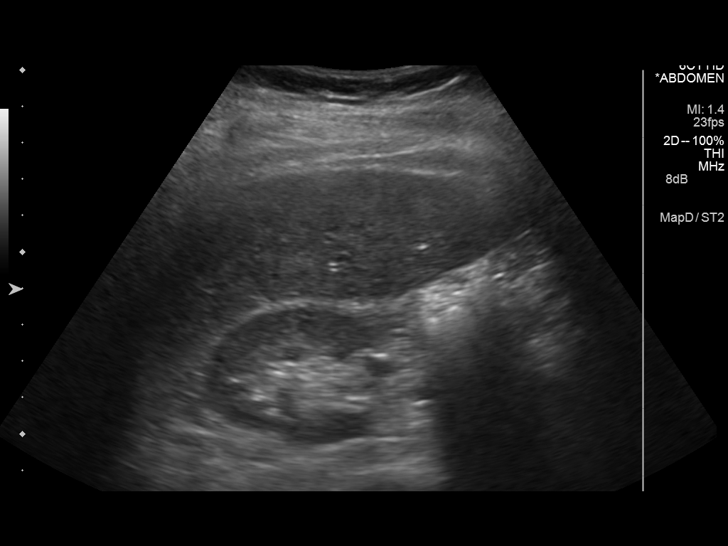
[im 19/55]
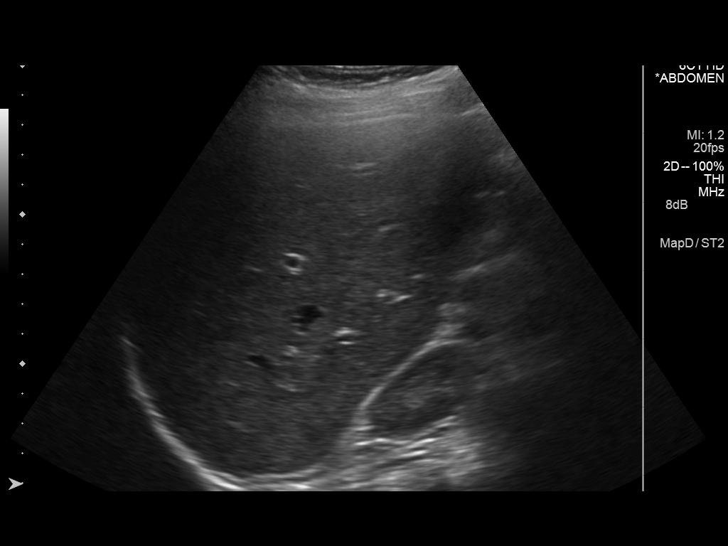
[im 21/55]
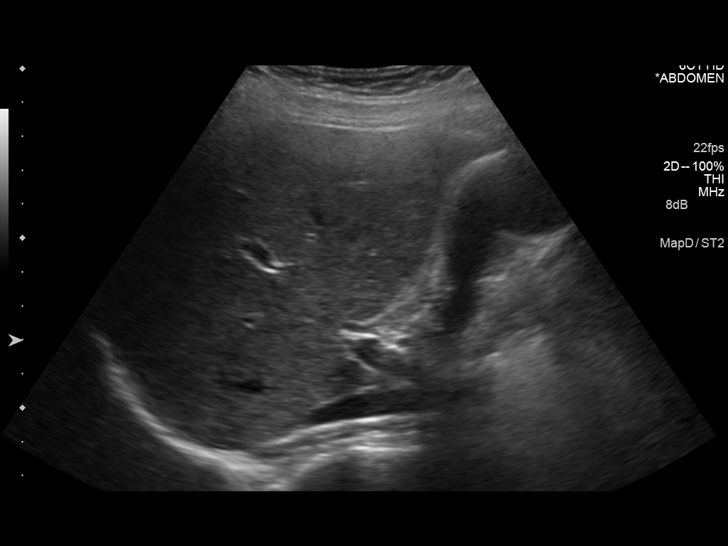
[im 25/55]
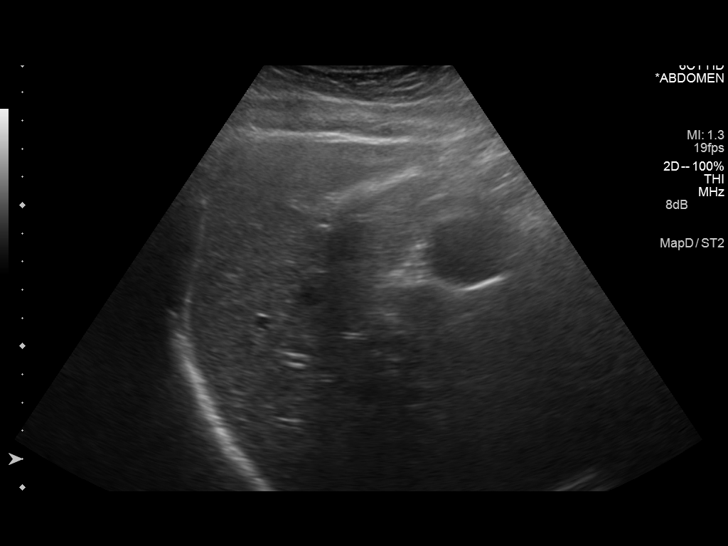
[im 30/55]
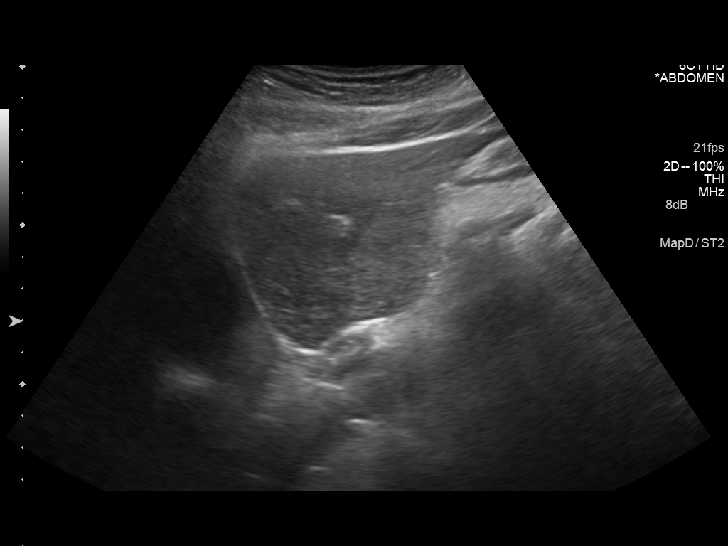
[im 34/55]
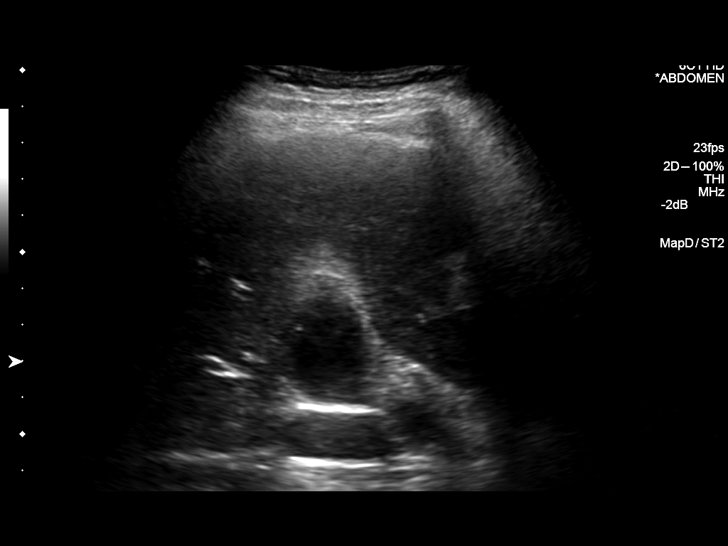
[im 37/55]
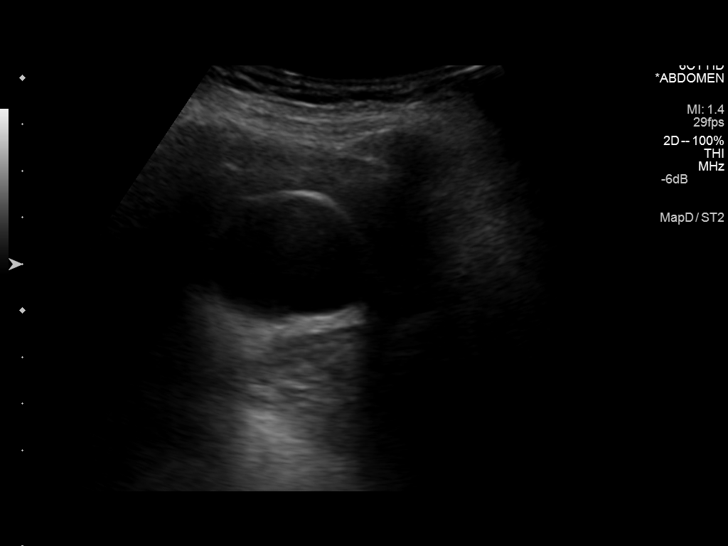
[im 41/55]
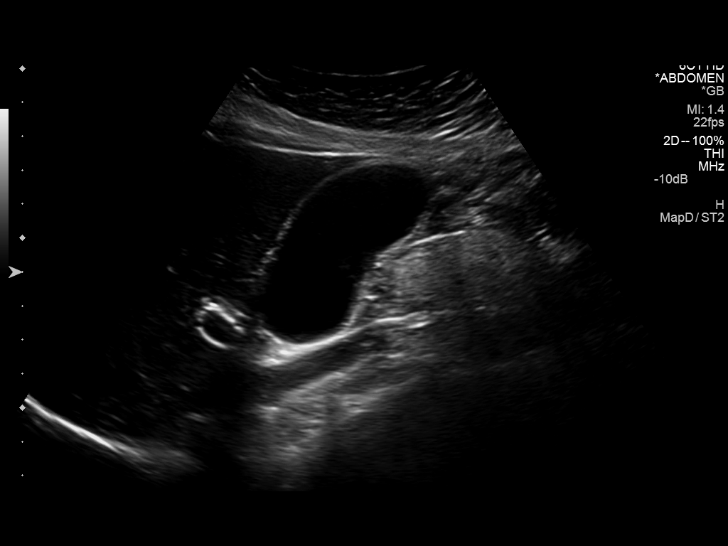
[im 46/55]
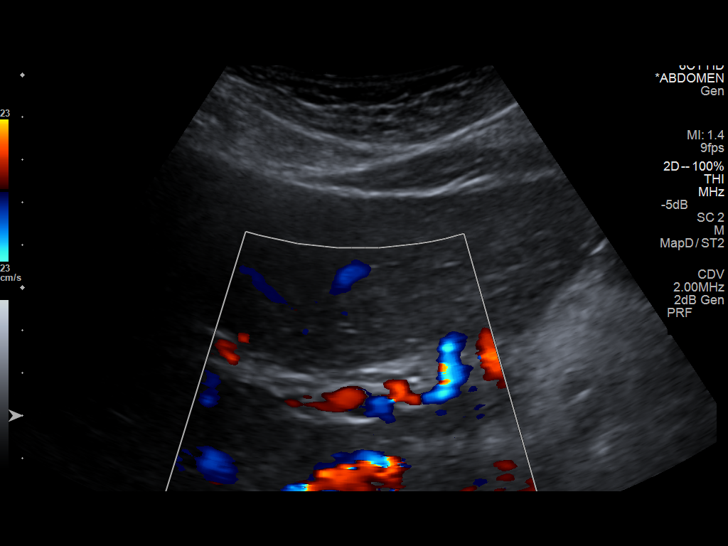
[im 50/55]
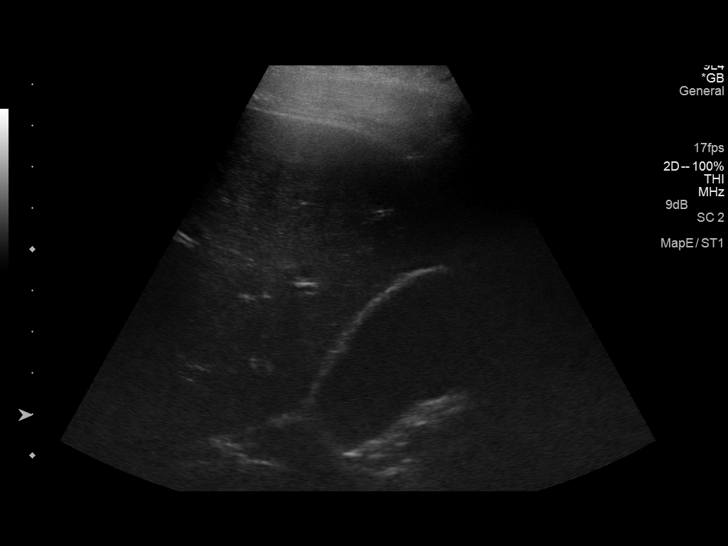
[im 55/55]
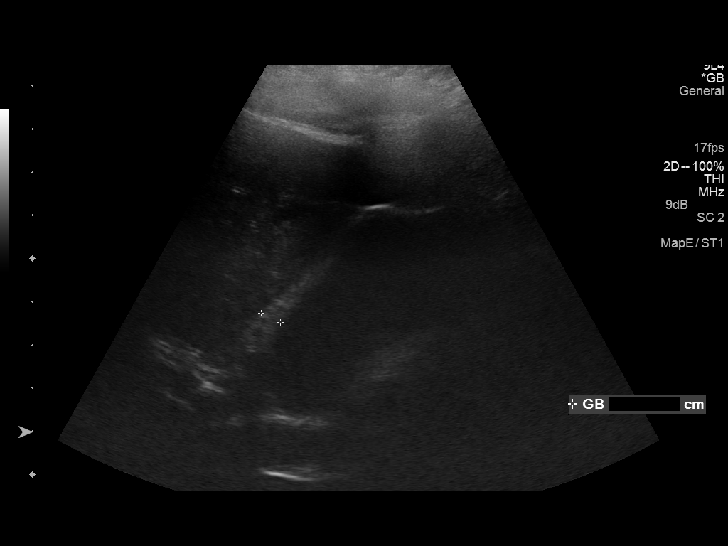

[14 of 25 positions shown; findings below may reference images not displayed]

FINDINGS: Gallbladder:

No gallstones are identified. The gallbladder does appear to be
distended and there is top-normal thickness to the gallbladder wall
at the level of the neck of 4 mm. No sonographic Murphy's sign.

Common bile duct:

Diameter: Normal caliber of 3 mm.

Liver:

No focal lesion identified. Within normal limits in parenchymal
echogenicity.
IMPRESSION: Relatively distended gallbladder without gallstones. Mildly
prominent wall thickness at the level of the gallbladder neck. This
is nonspecific. However, findings may be related to biliary
dyskinesia and correlation with nuclear medicine hepatobiliary
scintigraphy may be helpful.

## 2014-01-07 IMAGING — CT CT ABD-PELV W/ CM
2 of 4 series · 15 of 46 positions shown, 17 images · IV contrast (isovue)
Comparison: CT of the abdomen and pelvis performed [DATE], and
right upper quadrant ultrasound performed earlier today at [DATE] p.m.

CLINICAL DATA: Right upper quadrant abdominal pain.

EXAM:
CT ABDOMEN AND PELVIS WITH CONTRAST
TECHNIQUE: Multidetector CT imaging of the abdomen and pelvis was performed
using the standard protocol following bolus administration of
intravenous contrast.
CONTRAST:  100 mL of Isovue 370 IV contrast

[Series 2: routine abd pel with · axial · 0.72mm/px · z∈[-403,+37]mm · 12 of 102 slices shown, 14 images]
[im 9/102  soft-tissue]
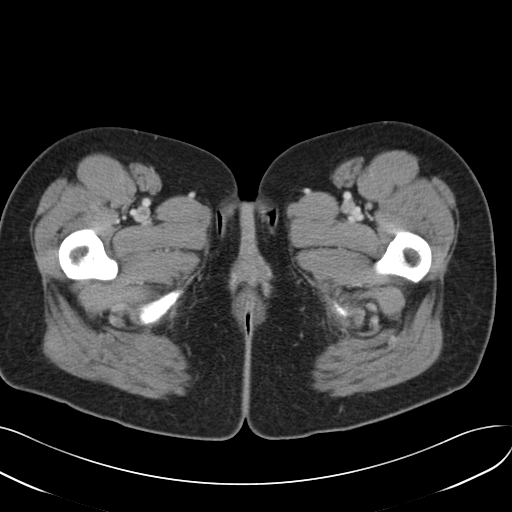
[im 9/102  bone]
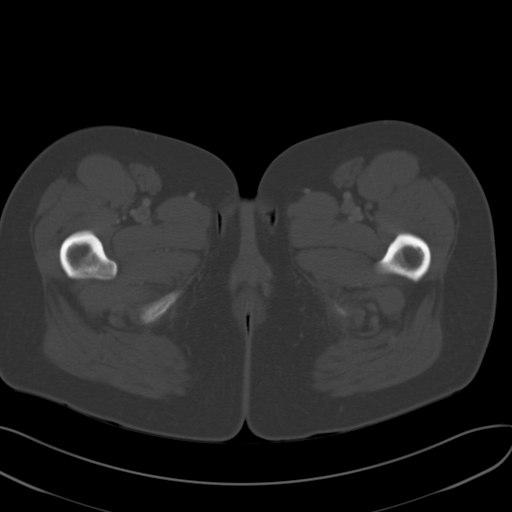
[im 17/102  soft-tissue]
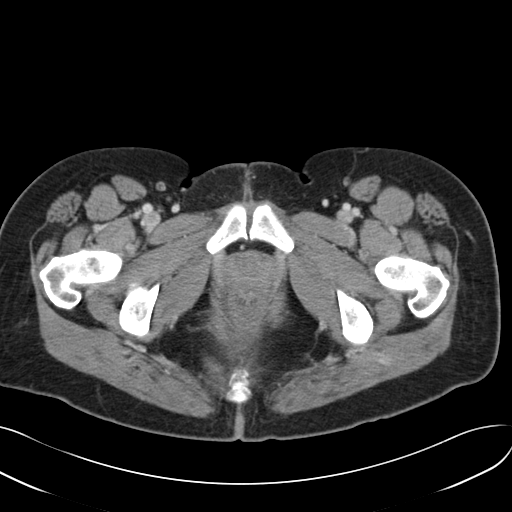
[im 25/102  soft-tissue]
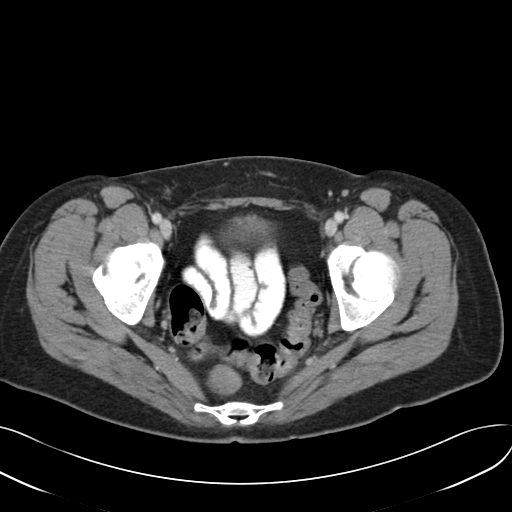
[im 33/102  soft-tissue]
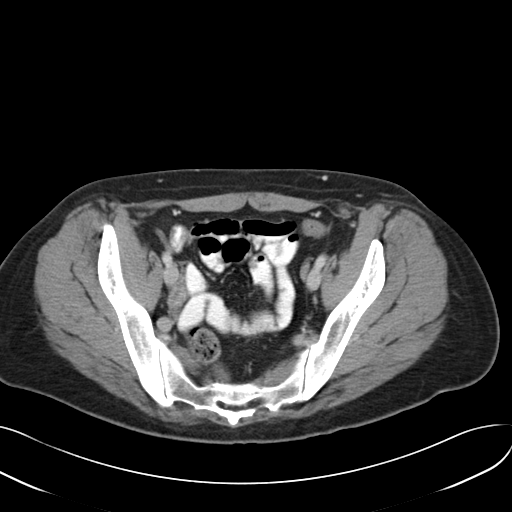
[im 41/102  soft-tissue]
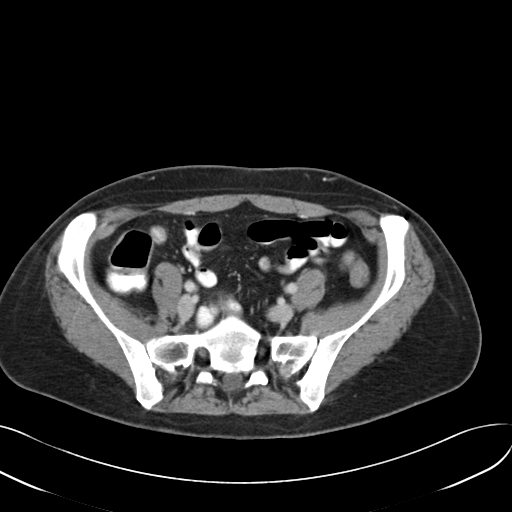
[im 49/102  soft-tissue]
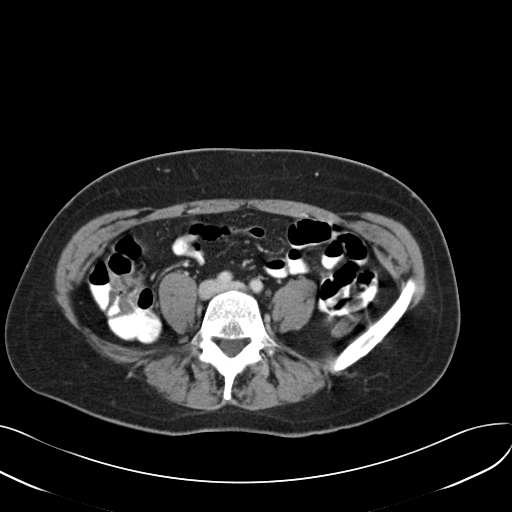
[im 57/102  soft-tissue]
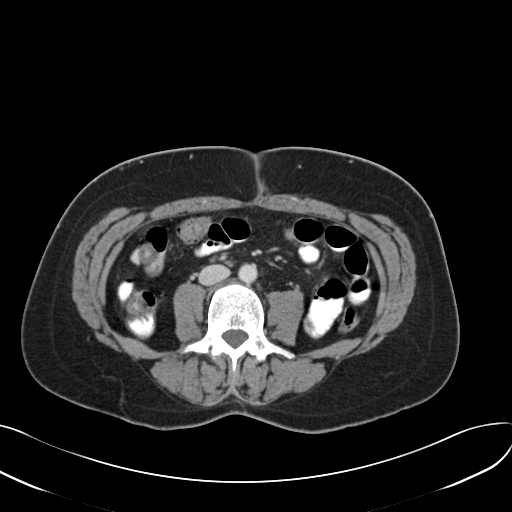
[im 65/102  soft-tissue]
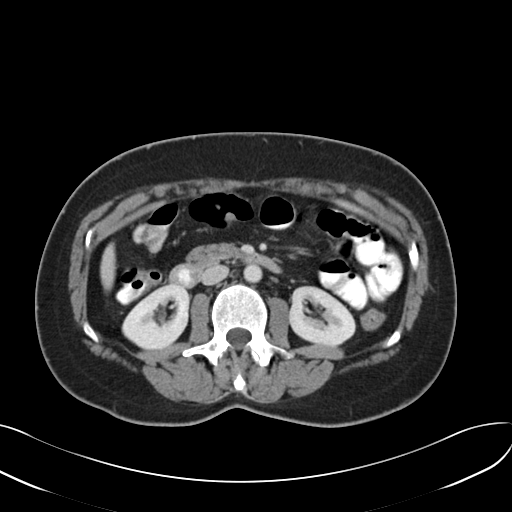
[im 73/102  soft-tissue]
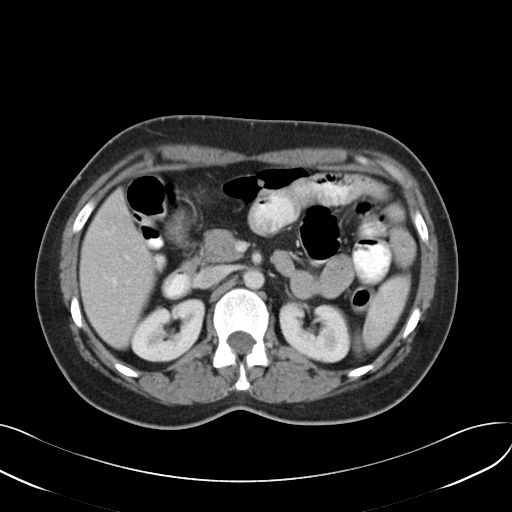
[im 73/102  bone]
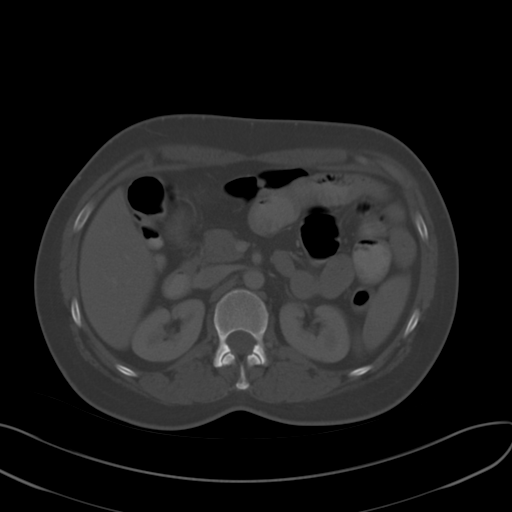
[im 81/102  soft-tissue]
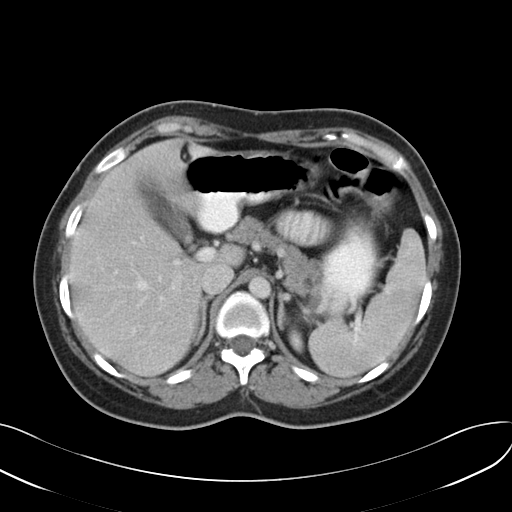
[im 89/102  soft-tissue]
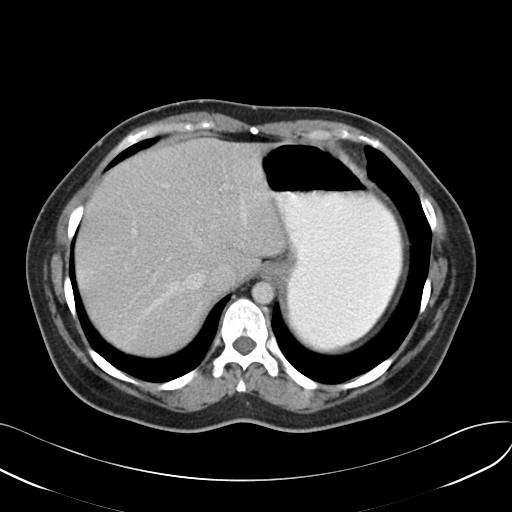
[im 97/102  soft-tissue]
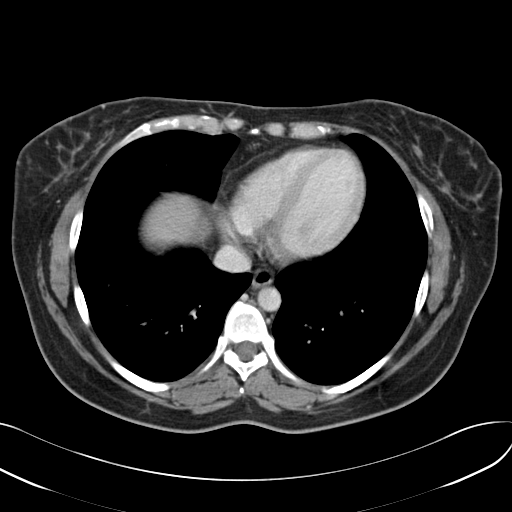

[Series 5: cor routine abd pel with · coronal · 0.71mm/px · 3 of 115 slices shown]
[im 39/115  soft-tissue]
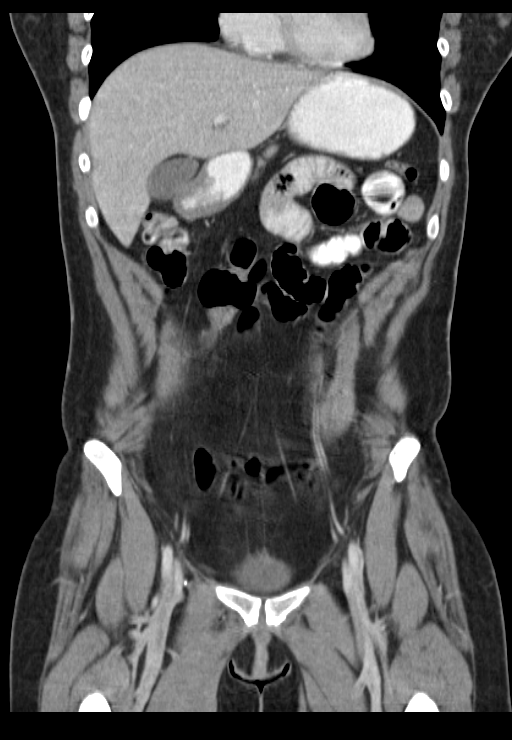
[im 51/115  soft-tissue]
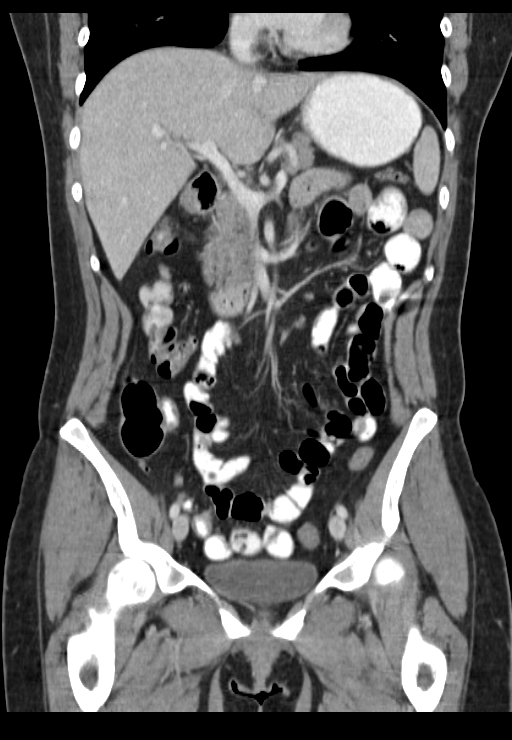
[im 64/115  soft-tissue]
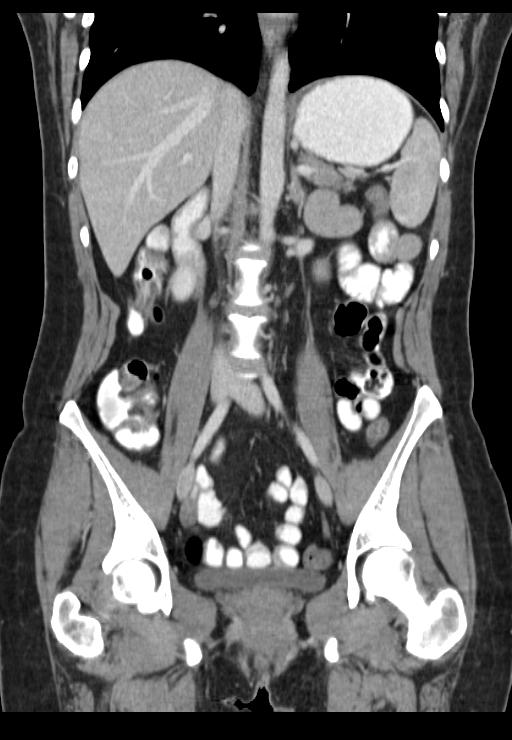

[15 of 46 positions shown; findings below may reference images not displayed]

FINDINGS: The visualized lung bases are clear.

The liver and spleen are unremarkable in appearance. The gallbladder
is within normal limits. The pancreas and adrenal glands are
unremarkable.

The kidneys are unremarkable in appearance. There is no evidence of
hydronephrosis. No renal or ureteral stones are seen. No perinephric
stranding is appreciated.

No free fluid is identified. Apparent thickening at the first
segment of the duodenum is thought to remain within normal limits.
The small bowel is otherwise unremarkable in appearance. The stomach
is within normal limits. No acute vascular abnormalities are seen.

The appendix is normal in caliber and contains air, without evidence
for appendicitis. Contrast progresses to the transverse colon. The
colon is unremarkable in appearance.

The bladder is mildly distended and grossly unremarkable. The
patient appears to be status post hysterectomy. The ovaries are
relatively symmetric; no suspicious adnexal masses are seen. No
inguinal lymphadenopathy is seen.

No acute osseous abnormalities are identified.
IMPRESSION: 1. No definite acute abnormality seen within the abdomen or pelvis.
2. Apparent wall thickening at the first segment of the duodenum is
thought to remain within normal limits, though if the patient's
symptoms persist, endoscopy might be considered for further
evaluation.

## 2015-01-30 ENCOUNTER — Ambulatory Visit
Admission: RE | Admit: 2015-01-30 | Discharge: 2015-01-30 | Disposition: A | Discharge status: Home / Self Care | Payer: Managed Care, Other (non HMO) | Source: Ambulatory Visit | Attending: Family Medicine | Admitting: Family Medicine

## 2015-01-30 ENCOUNTER — Other Ambulatory Visit: Payer: Self-pay | Admitting: Family Medicine

## 2015-01-30 DIAGNOSIS — M5489 Other dorsalgia: Secondary | ICD-10-CM

## 2015-01-30 IMAGING — CR DG SACRUM/COCCYX 2+V
3 series · 3 of 3 positions shown · non-contrast
Comparison: CT abdomen and pelvis [DATE].

CLINICAL DATA: Low back pain for 2 months, worsened over the past 2
weeks. No known injury. Initial encounter.

EXAM:
SACRUM AND COCCYX - 2+ VIEW

[t sacrum a.p.]
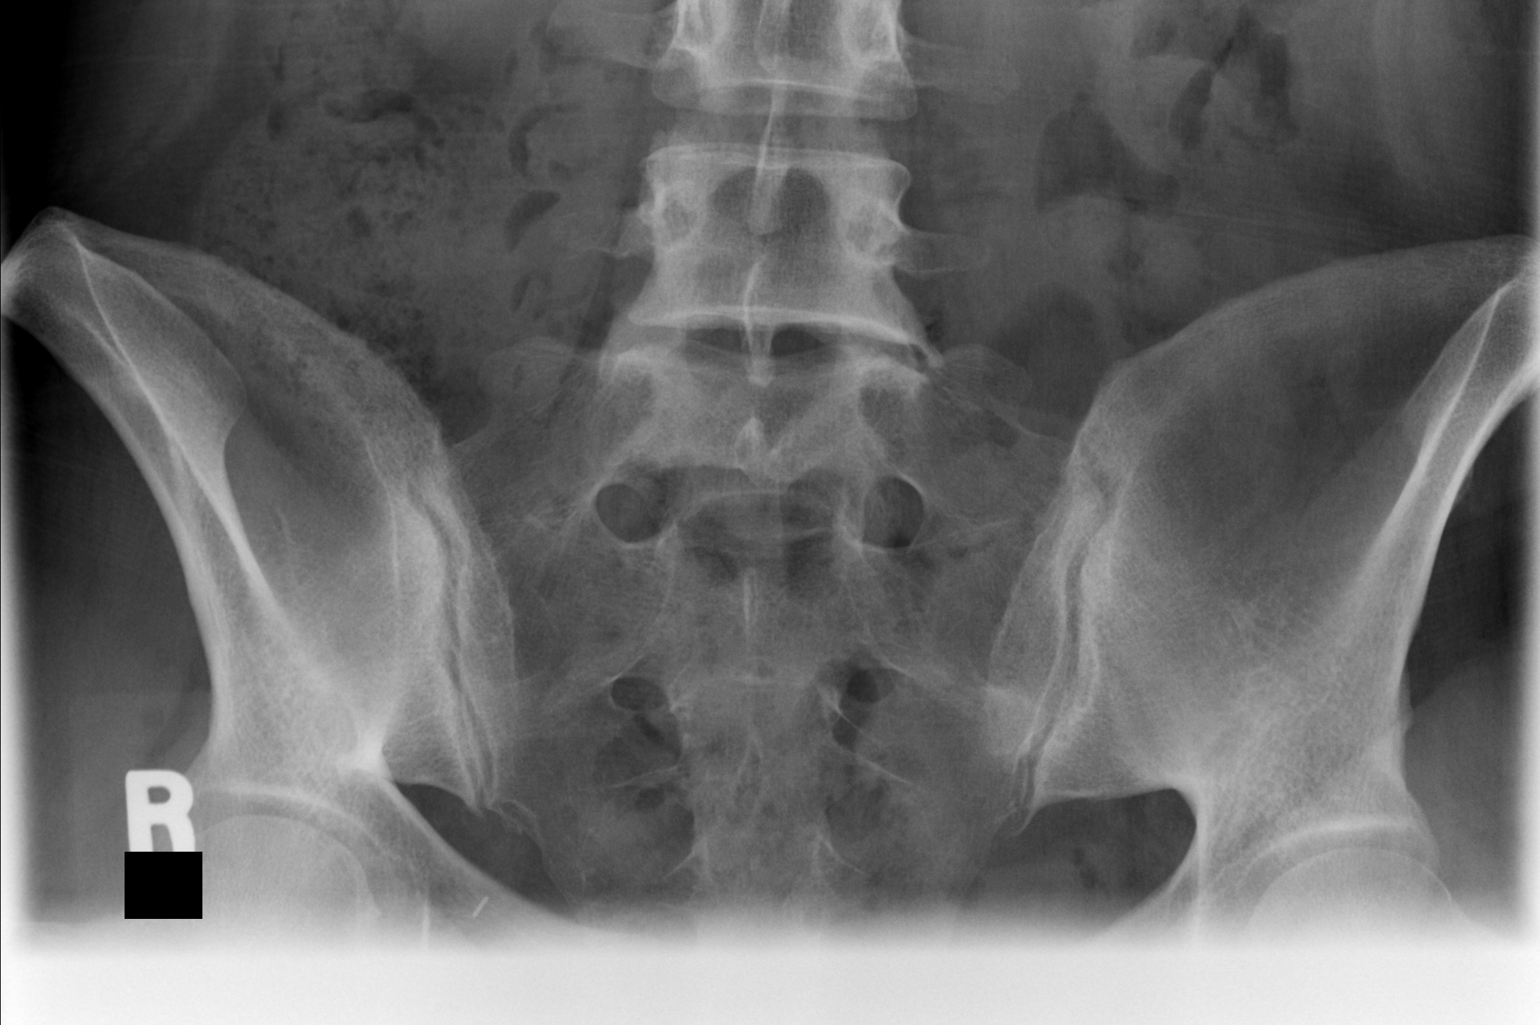

[t coccyx a.p.]
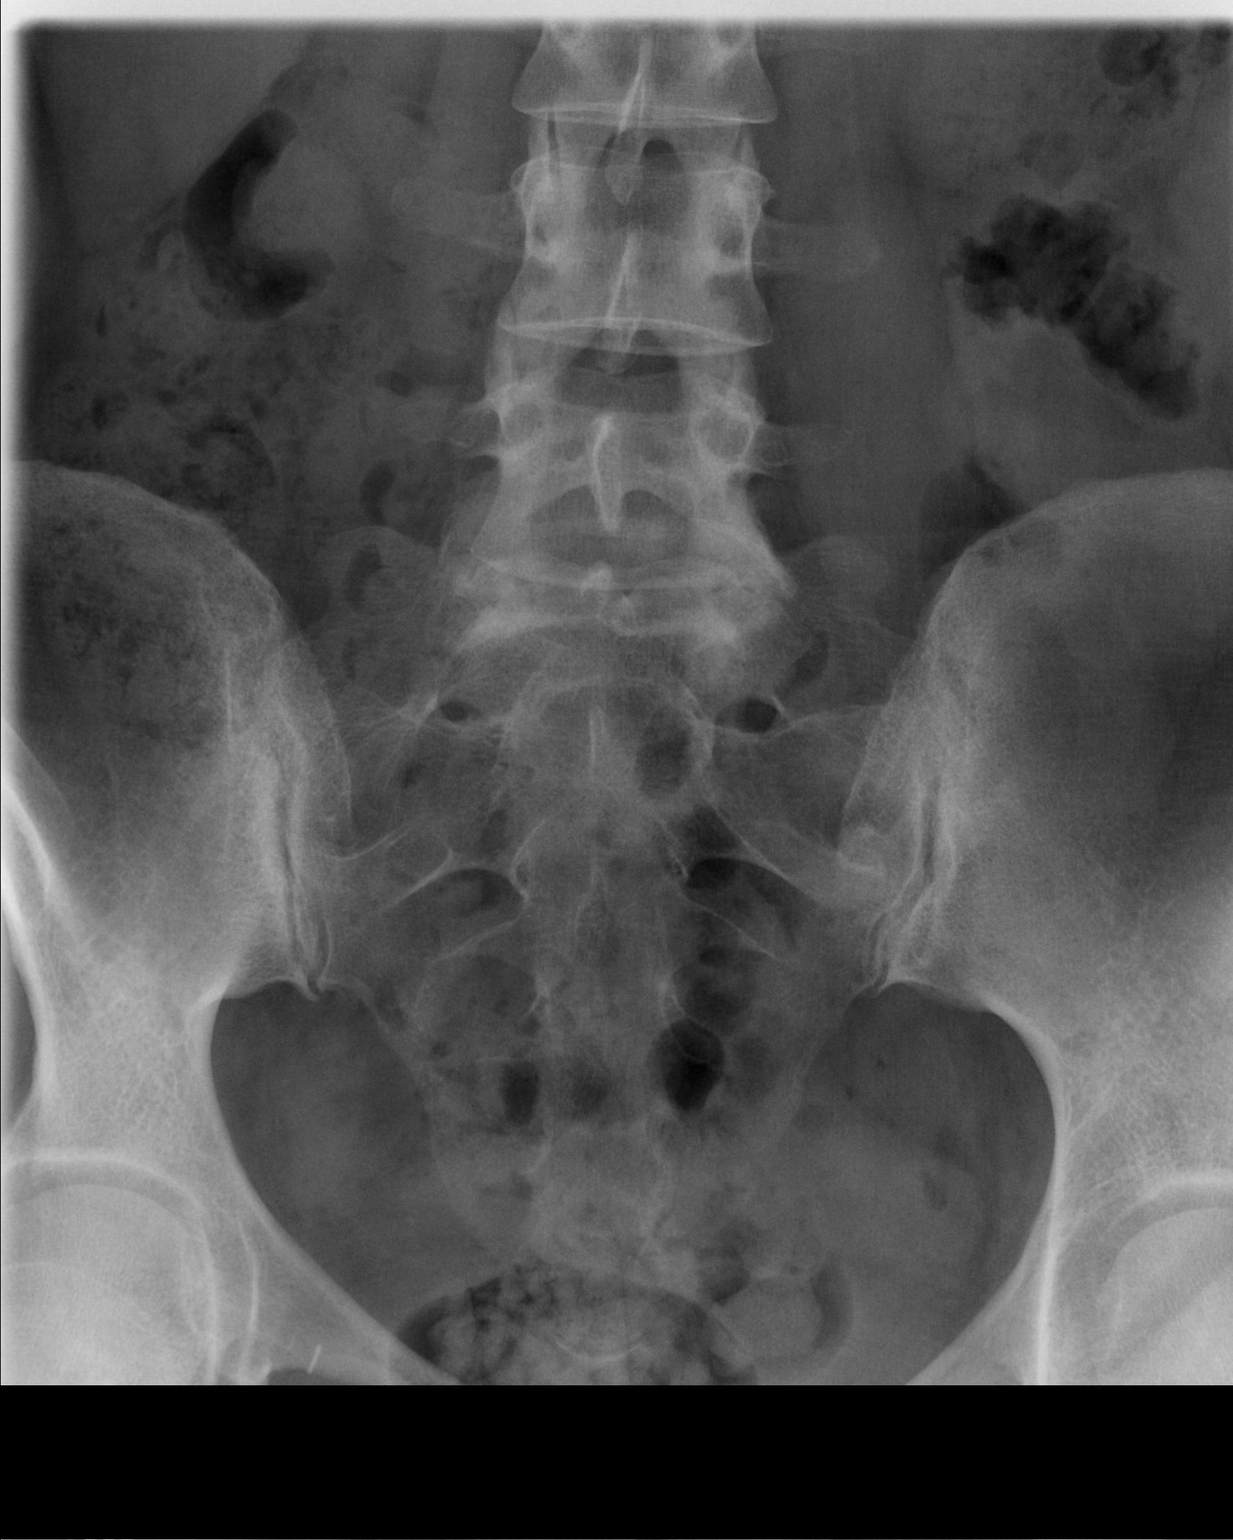

[t coccyx lat]
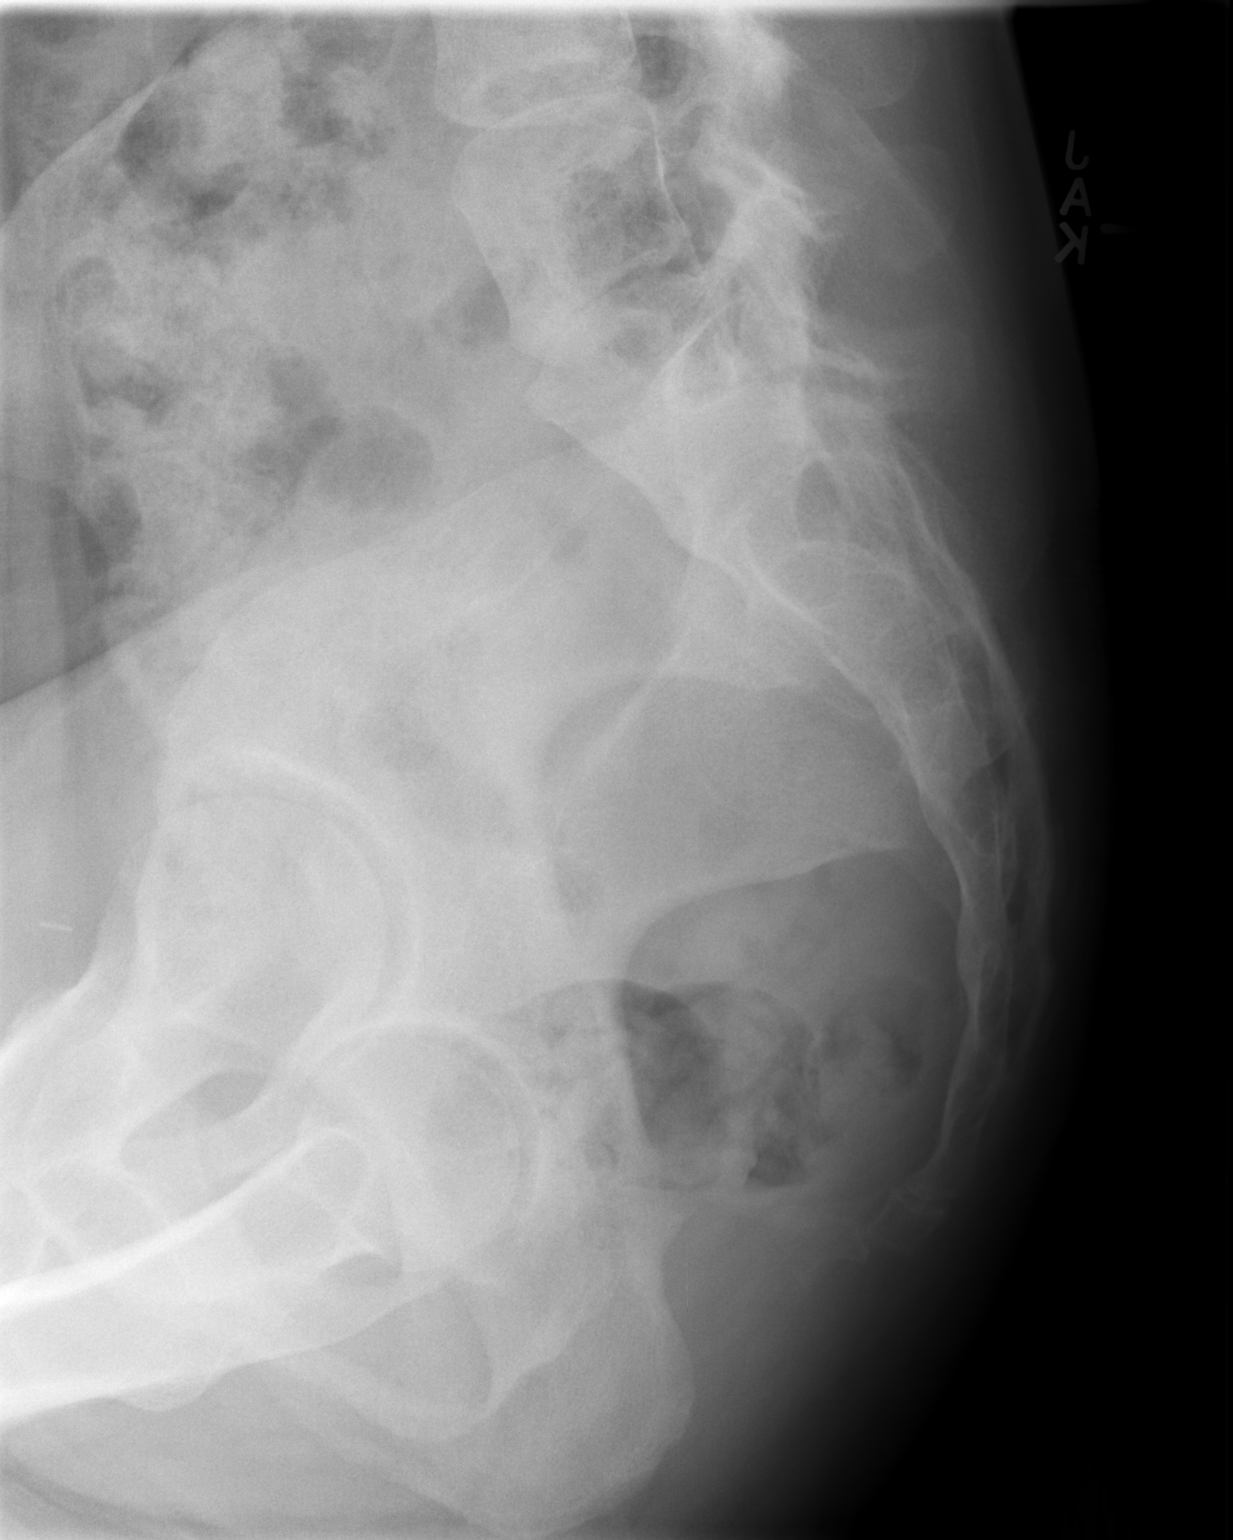

[3 of 3 positions shown; findings below may reference images not displayed]

FINDINGS: There is no evidence of fracture or other focal bone lesions.
IMPRESSION: Normal exam.

## 2015-01-30 IMAGING — CR DG LUMBAR SPINE 2-3V
3 series · 3 of 3 positions shown · non-contrast
Comparison: CT abdomen and pelvis [DATE].

CLINICAL DATA: Low back pain for 2 months which has worsened over
the past 2 weeks. No known injury. Initial encounter.

EXAM:
LUMBAR SPINE - 2-3 VIEW

[t l-spine a.p.]
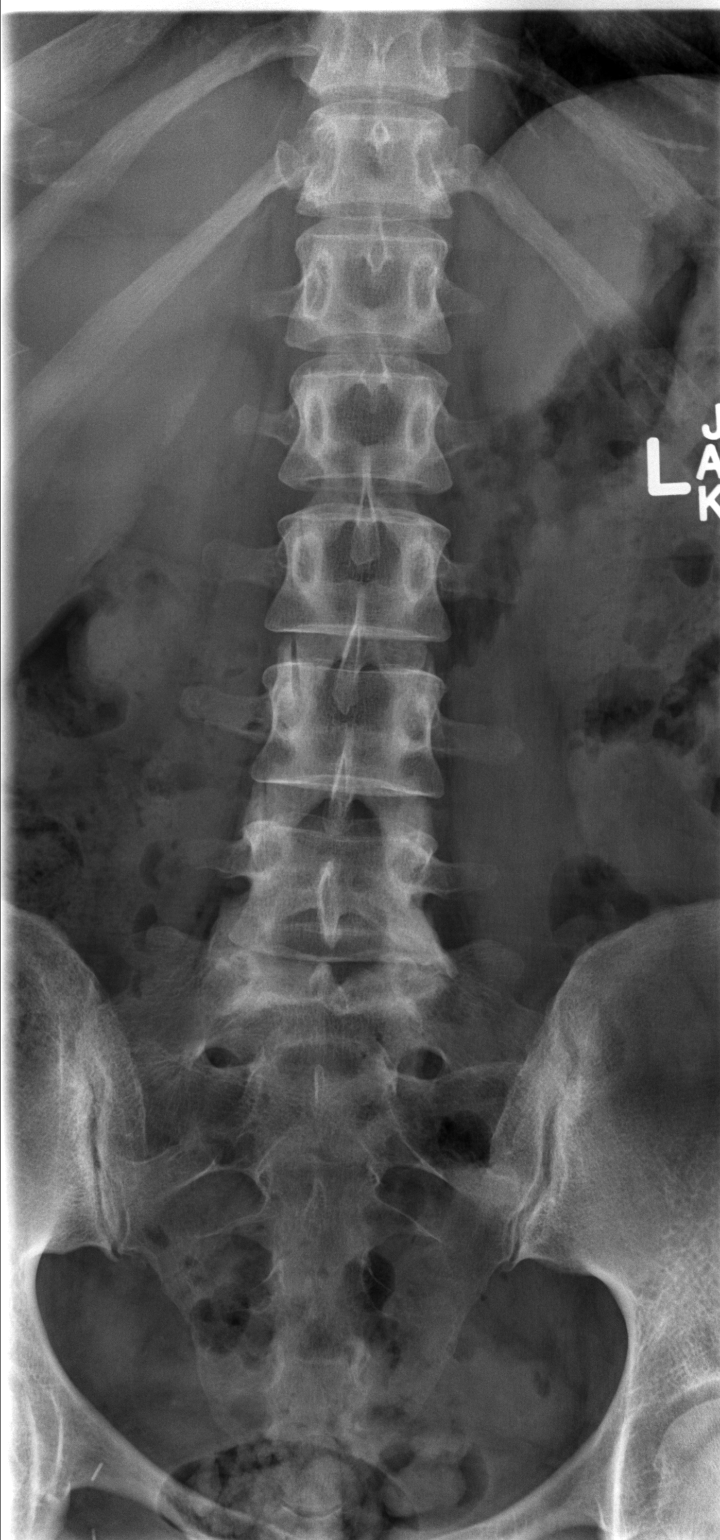

[t l-spine lat]
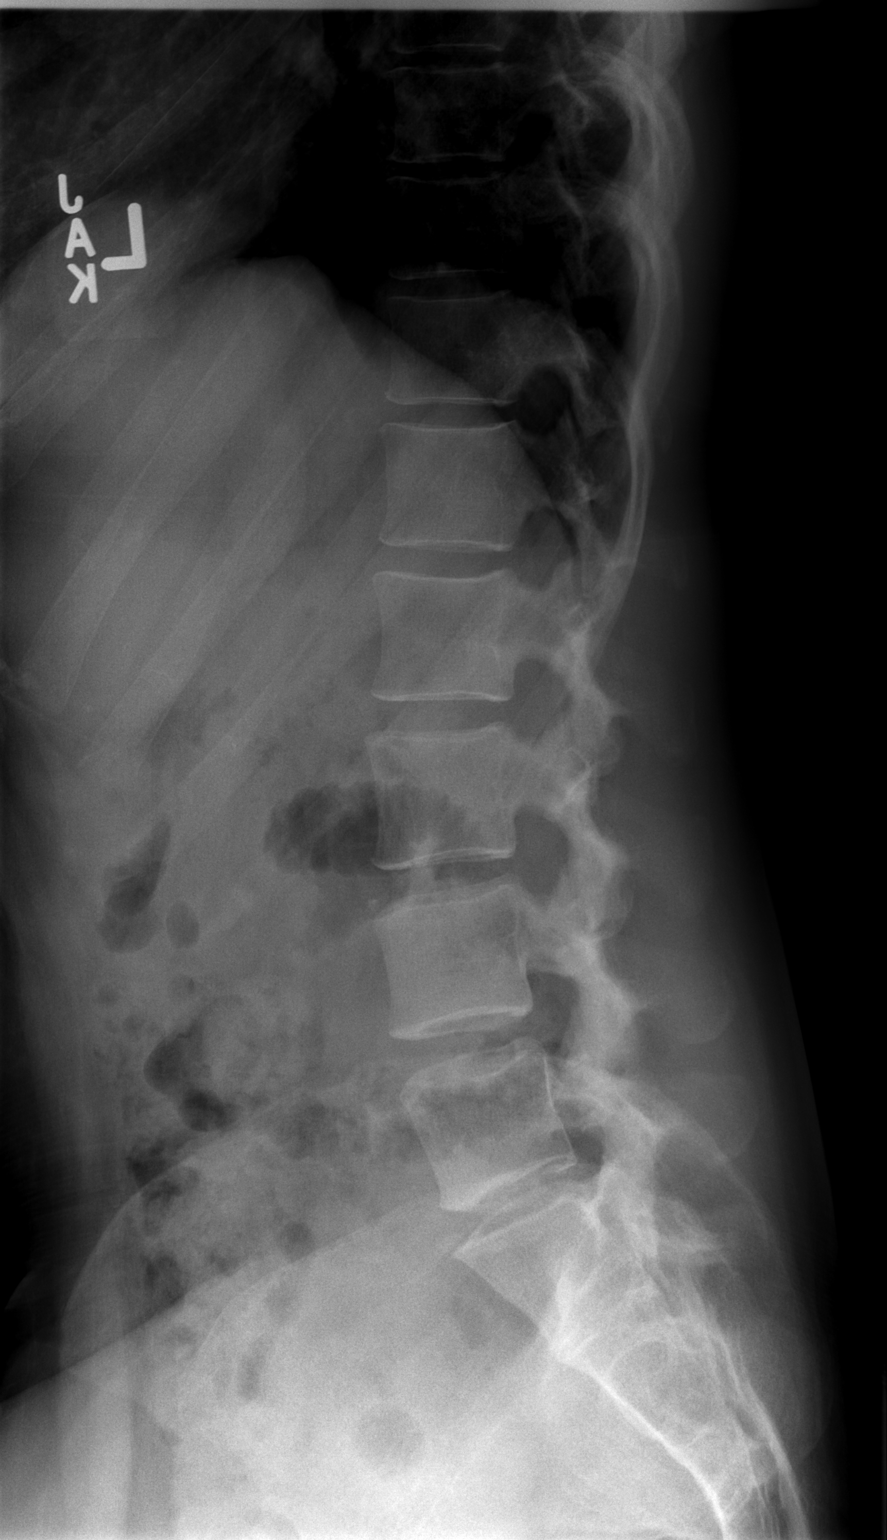

[t l-spine l5-s1 spot]
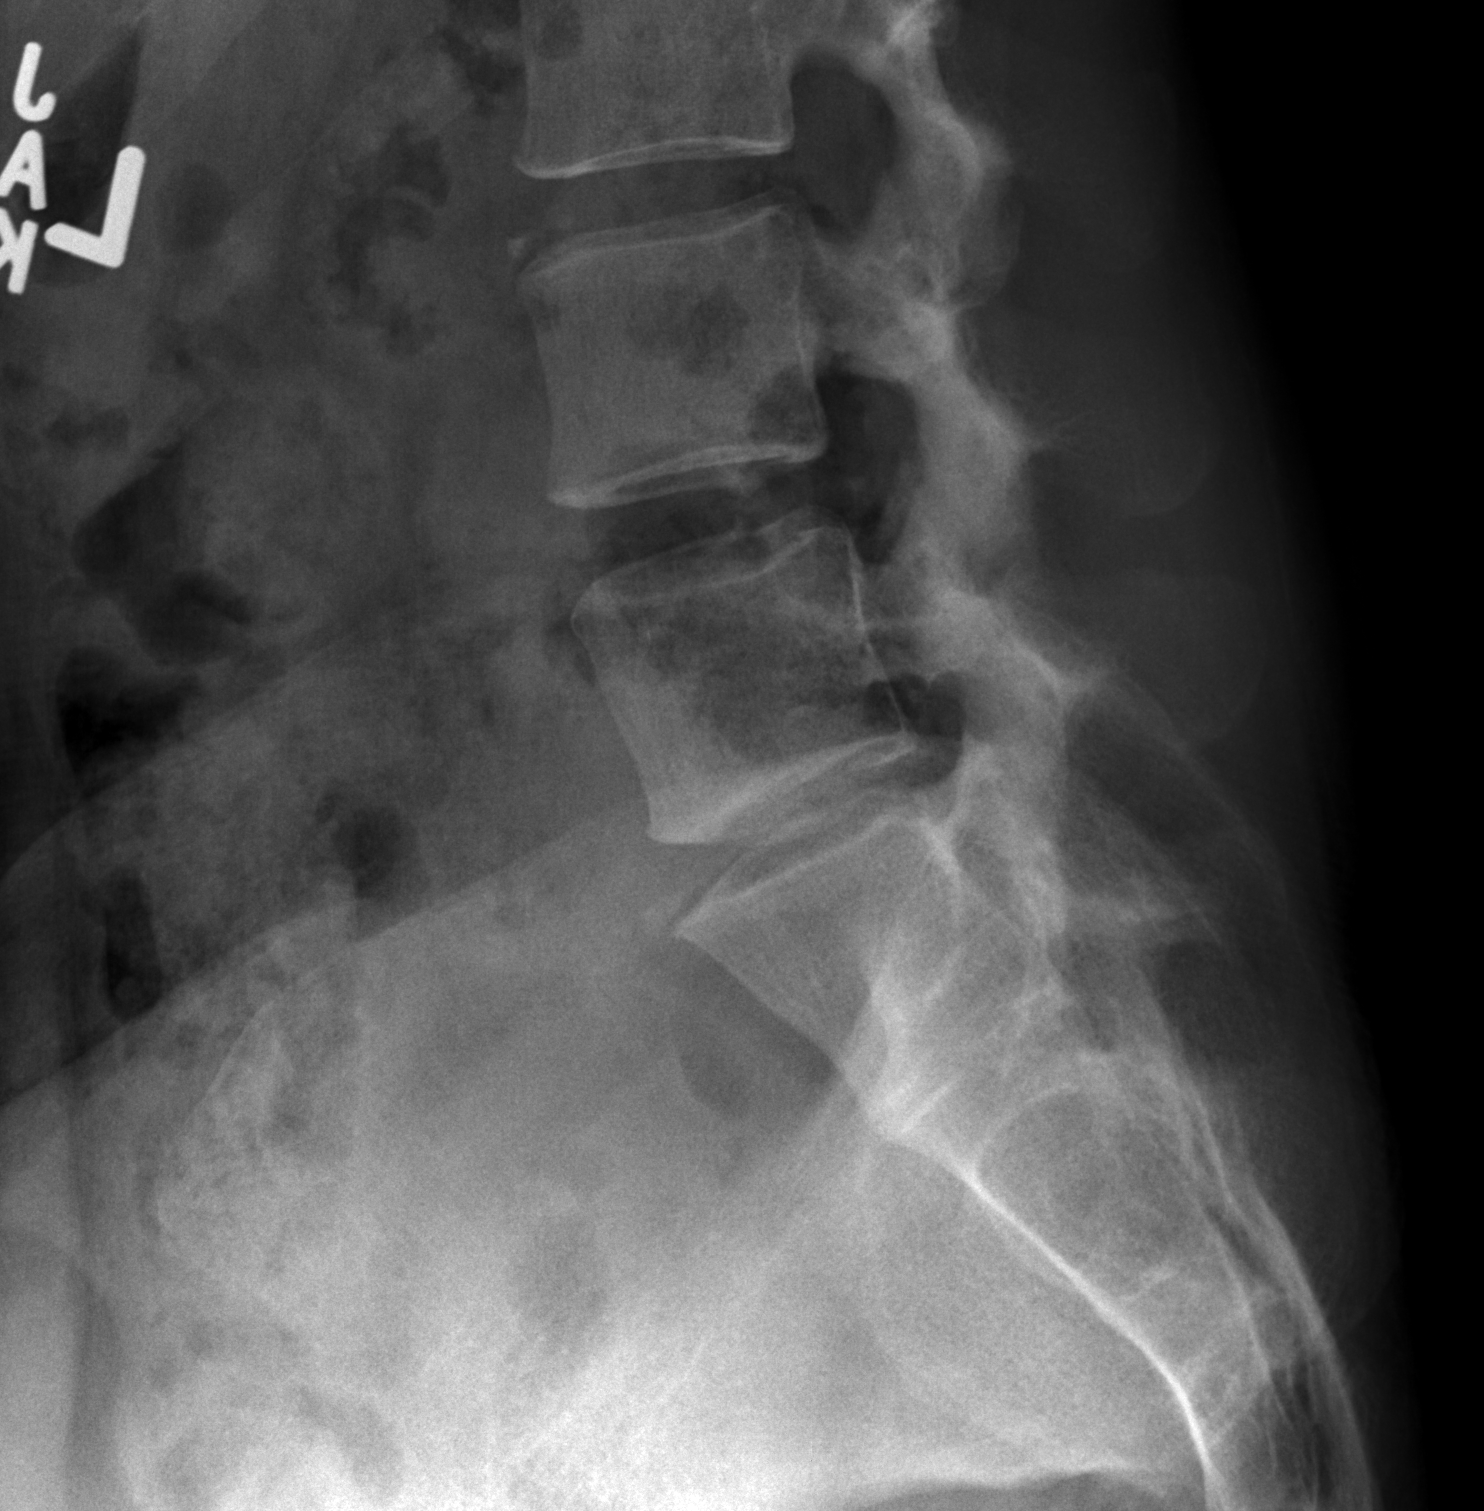

[3 of 3 positions shown; findings below may reference images not displayed]

FINDINGS: There is no evidence of lumbar spine fracture. Alignment is normal.
Intervertebral disc spaces are maintained.
IMPRESSION: Negative exam.

## 2015-07-31 ENCOUNTER — Inpatient Hospital Stay (HOSPITAL_COMMUNITY)
Admission: AD | Admit: 2015-07-31 | Discharge: 2015-07-31 | Disposition: A | Discharge status: Home / Self Care | Payer: 59 | Source: Ambulatory Visit | Attending: Obstetrics and Gynecology | Admitting: Obstetrics and Gynecology

## 2015-07-31 ENCOUNTER — Encounter (HOSPITAL_COMMUNITY): Payer: Self-pay | Admitting: *Deleted

## 2015-07-31 DIAGNOSIS — N760 Acute vaginitis: Secondary | ICD-10-CM | POA: Diagnosis present

## 2015-07-31 DIAGNOSIS — Z90710 Acquired absence of both cervix and uterus: Secondary | ICD-10-CM | POA: Insufficient documentation

## 2015-07-31 DIAGNOSIS — Z87891 Personal history of nicotine dependence: Secondary | ICD-10-CM | POA: Insufficient documentation

## 2015-07-31 HISTORY — DX: Accidental discharge from unspecified firearms or gun, initial encounter: W34.00XA

## 2015-07-31 HISTORY — DX: Unilateral inguinal hernia, without obstruction or gangrene, not specified as recurrent: K40.90

## 2015-07-31 MED ORDER — ONDANSETRON 8 MG PO TBDP: 8.0000 mg | ORAL_TABLET | Freq: Three times a day (TID) | ORAL | Status: DC | PRN

## 2015-07-31 MED ORDER — IBUPROFEN 600 MG PO TABS: 600.0000 mg | ORAL_TABLET | Freq: Four times a day (QID) | ORAL | Status: DC | PRN

## 2015-07-31 MED ORDER — HYDROMORPHONE HCL 1 MG/ML IJ SOLN
1.0000 mg | Freq: Once | INTRAMUSCULAR | Status: AC
  Administered 2015-07-31: 1 mg via INTRAMUSCULAR
  Filled 2015-07-31: qty 1

## 2015-07-31 MED ORDER — OXYCODONE-ACETAMINOPHEN 5-325 MG PO TABS: 1.0000 | ORAL_TABLET | ORAL | Status: DC | PRN

## 2015-07-31 NOTE — [facility] Note (Signed)
Pt was seen by NP @ [facility] this a.m., was sent to [facility] from there, was then sent to [facility] - for vaginal abcess.  Pt states she had "growth" at vagina 2 weeks ago, began having back pain 2 days ago.  Pt on antibiotcs.
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
## 2015-07-31 NOTE — Discharge Instructions (Signed)
Continue antibiotics.  Sitz baths three times a day.   Abscess An abscess is an infected area that contains a collection of pus and debris.It can occur in almost any part of the body. An abscess is also known as a furuncle or boil. CAUSES  An abscess occurs when tissue gets infected. This can occur from blockage of oil or sweat glands, infection of hair follicles, or a minor injury to the skin. As the body tries to fight the infection, pus collects in the area and creates pressure under the skin. This pressure causes pain. People with weakened immune systems have difficulty fighting infections and get certain abscesses more often.  SYMPTOMS Usually an abscess develops on the skin and becomes a painful mass that is red, warm, and tender. If the abscess forms under the skin, you may feel a moveable soft area under the skin. Some abscesses break open (rupture) on their own, but most will continue to get worse without care. The infection can spread deeper into the body and eventually into the bloodstream, causing you to feel ill.  DIAGNOSIS  Your caregiver will take your medical history and perform a physical exam. A sample of fluid may also be taken from the abscess to determine what is causing your infection. TREATMENT  Your caregiver may prescribe antibiotic medicines to fight the infection. However, taking antibiotics alone usually does not cure an abscess. Your caregiver may need to make a small cut (incision) in the abscess to drain the pus. In some cases, gauze is packed into the abscess to reduce pain and to continue draining the area. HOME CARE INSTRUCTIONS   Only take over-the-counter or prescription medicines for pain, discomfort, or fever as directed by your caregiver.  If you were prescribed antibiotics, take them as directed. Finish them even if you start to feel better.  If gauze is used, follow your caregiver's directions for changing the gauze.  To avoid spreading the  infection:  Keep your draining abscess covered with a bandage.  Wash your hands well.  Do not share personal care items, towels, or whirlpools with others.  Avoid skin contact with others.  Keep your skin and clothes clean around the abscess.  Keep all follow-up appointments as directed by your caregiver. SEEK MEDICAL CARE IF:   You have increased pain, swelling, redness, fluid drainage, or bleeding.  You have muscle aches, chills, or a general ill feeling.  You have a fever. MAKE SURE YOU:   Understand these instructions.  Will watch your condition.  Will get help right away if you are not doing well or get worse.   This information is not intended to replace advice given to you by your health care provider. Make sure you discuss any questions you have with your health care provider.   Document Released: 02/25/2005 Document Revised: 11/17/2011 Document Reviewed: 07/31/2011 Elsevier Interactive Patient Education [employer]

## 2020-08-06 ENCOUNTER — Ambulatory Visit (INDEPENDENT_AMBULATORY_CARE_PROVIDER_SITE_OTHER): Payer: PRIVATE HEALTH INSURANCE | Admitting: Sports Medicine

## 2020-08-06 ENCOUNTER — Other Ambulatory Visit: Payer: Self-pay

## 2020-08-06 ENCOUNTER — Ambulatory Visit (INDEPENDENT_AMBULATORY_CARE_PROVIDER_SITE_OTHER): Payer: PRIVATE HEALTH INSURANCE

## 2020-08-06 DIAGNOSIS — M5416 Radiculopathy, lumbar region: Secondary | ICD-10-CM

## 2020-08-06 DIAGNOSIS — M5412 Radiculopathy, cervical region: Secondary | ICD-10-CM

## 2020-08-06 DIAGNOSIS — S41131S Puncture wound without foreign body of right upper arm, sequela: Secondary | ICD-10-CM | POA: Diagnosis not present

## 2020-08-06 DIAGNOSIS — N809 Endometriosis, unspecified: Secondary | ICD-10-CM

## 2020-08-06 DIAGNOSIS — S41131A Puncture wound without foreign body of right upper arm, initial encounter: Secondary | ICD-10-CM | POA: Insufficient documentation

## 2020-08-06 DIAGNOSIS — F39 Unspecified mood [affective] disorder: Secondary | ICD-10-CM

## 2020-08-06 IMAGING — DX DG LUMBAR SPINE COMPLETE 4+V
5 series · 5 of 5 positions shown · non-contrast
Comparison: None.

CLINICAL DATA: Low back pain

EXAM:
LUMBAR SPINE - COMPLETE 4+ VIEW

[l-spine ap]
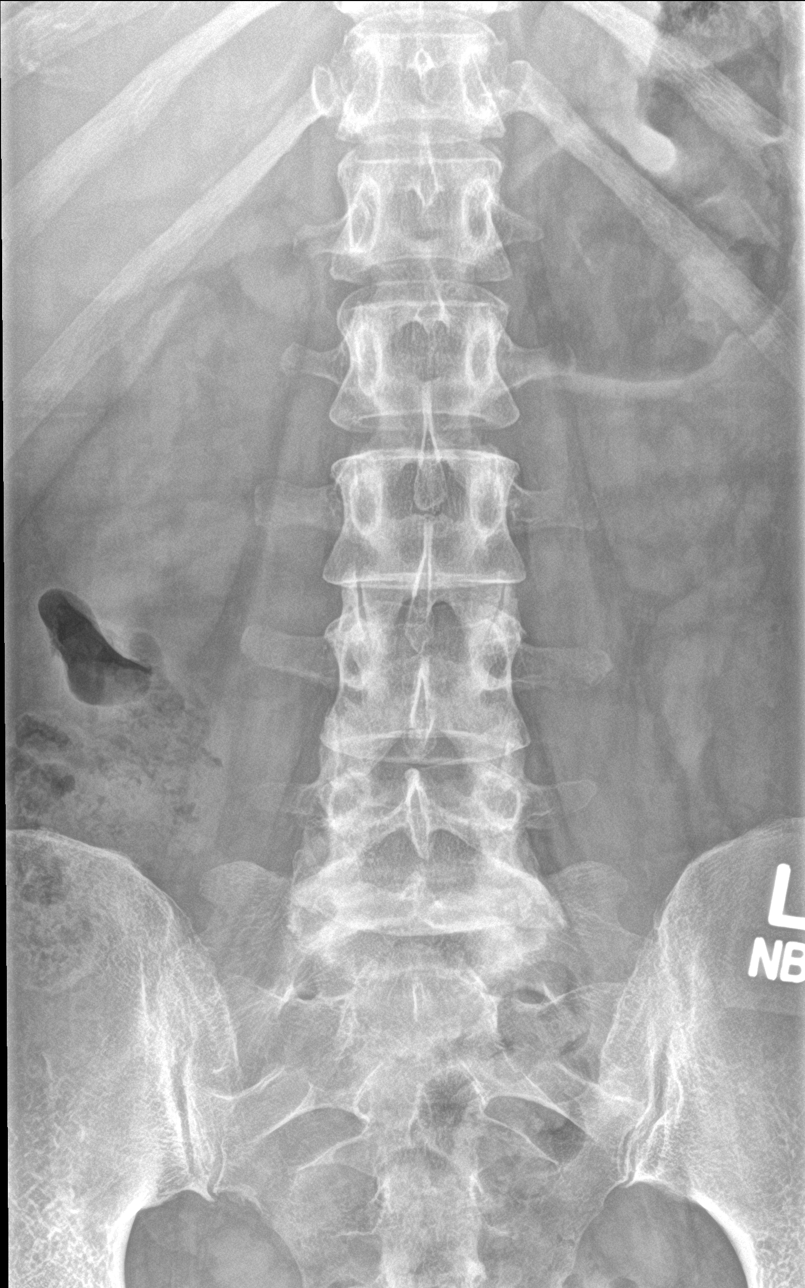

[l-spine obl (1 of 2)]
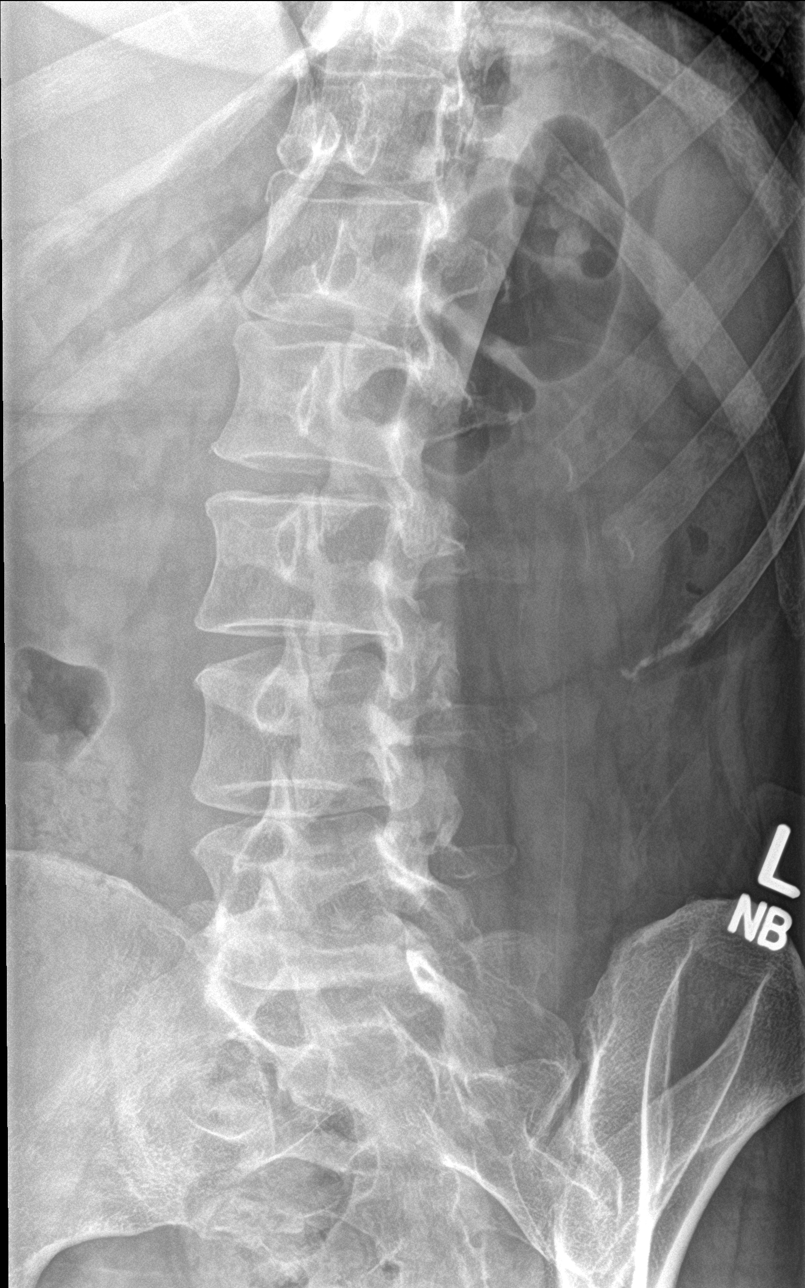

[l-spine obl (2 of 2)]
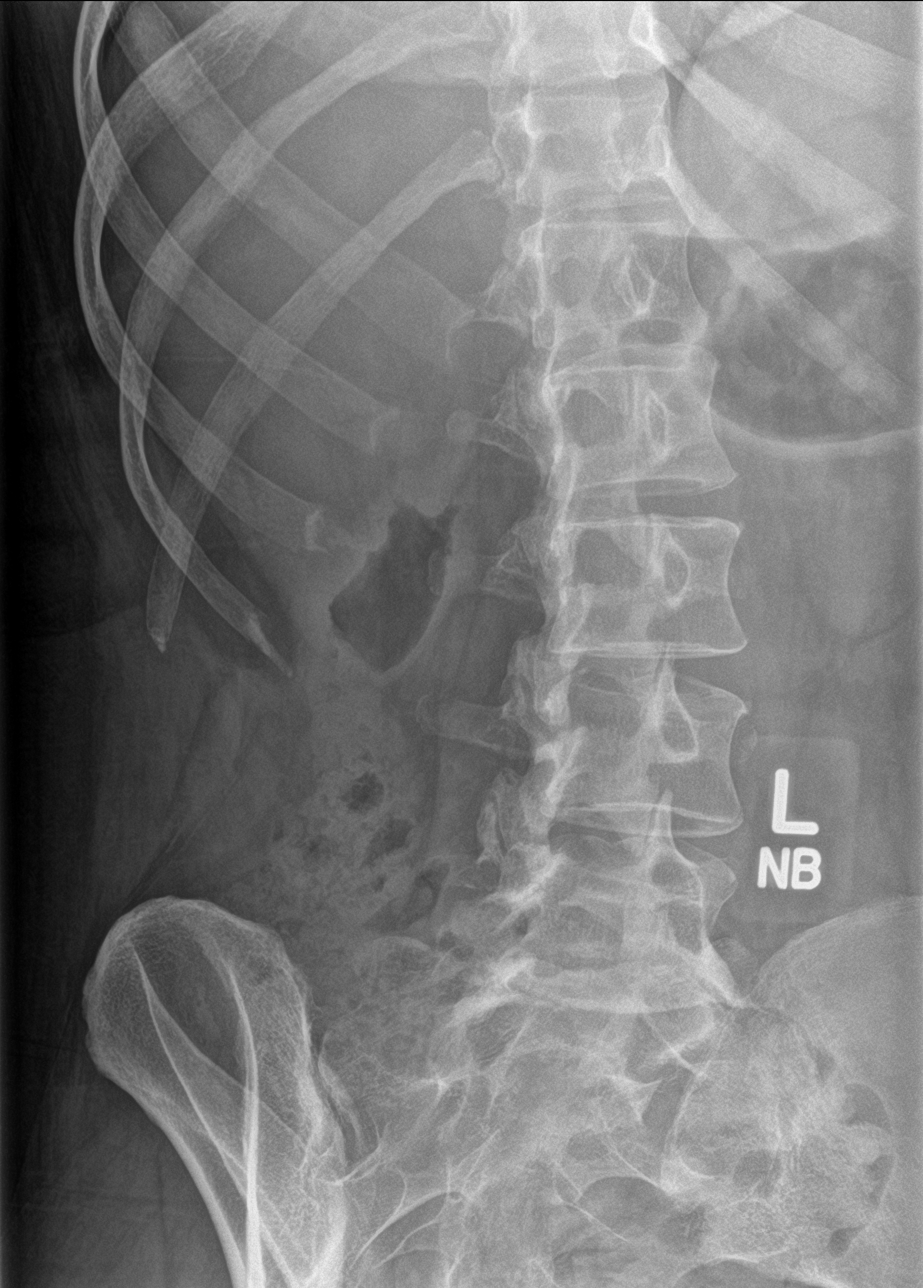

[l-spine lat]
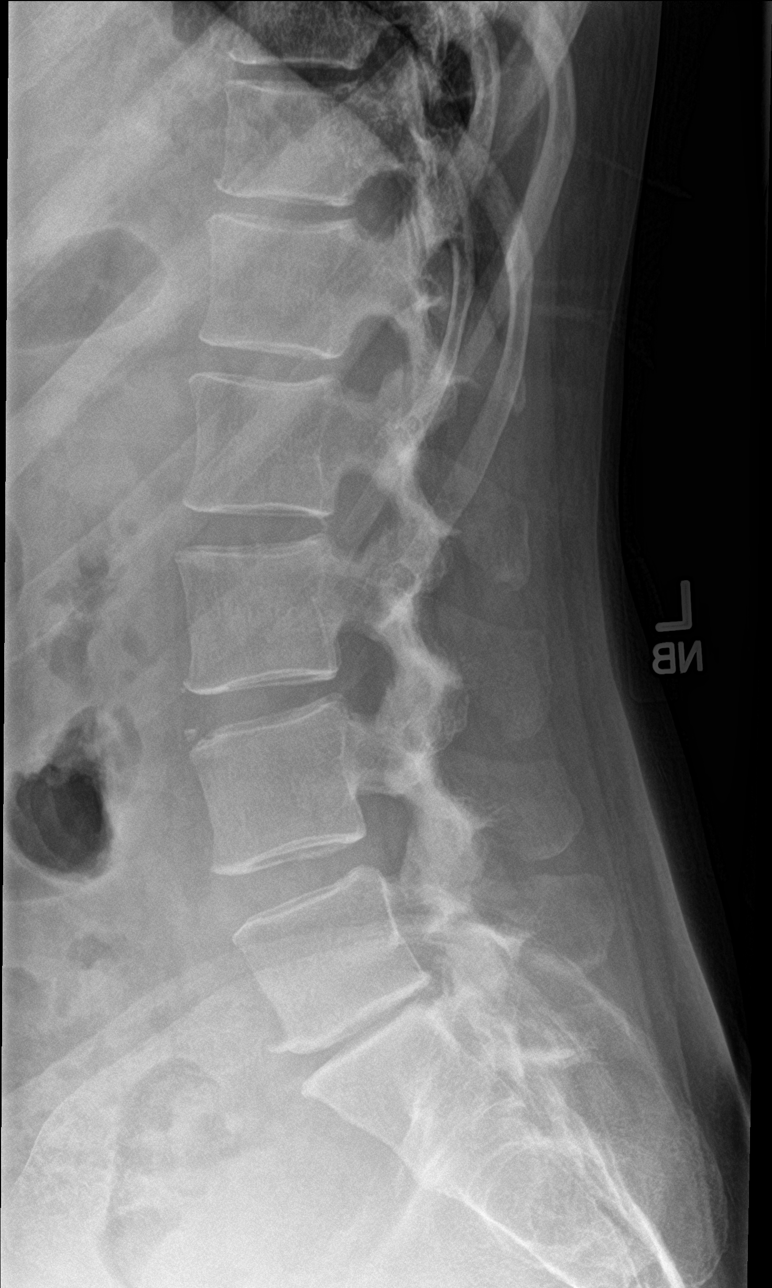

[l-spine spot]
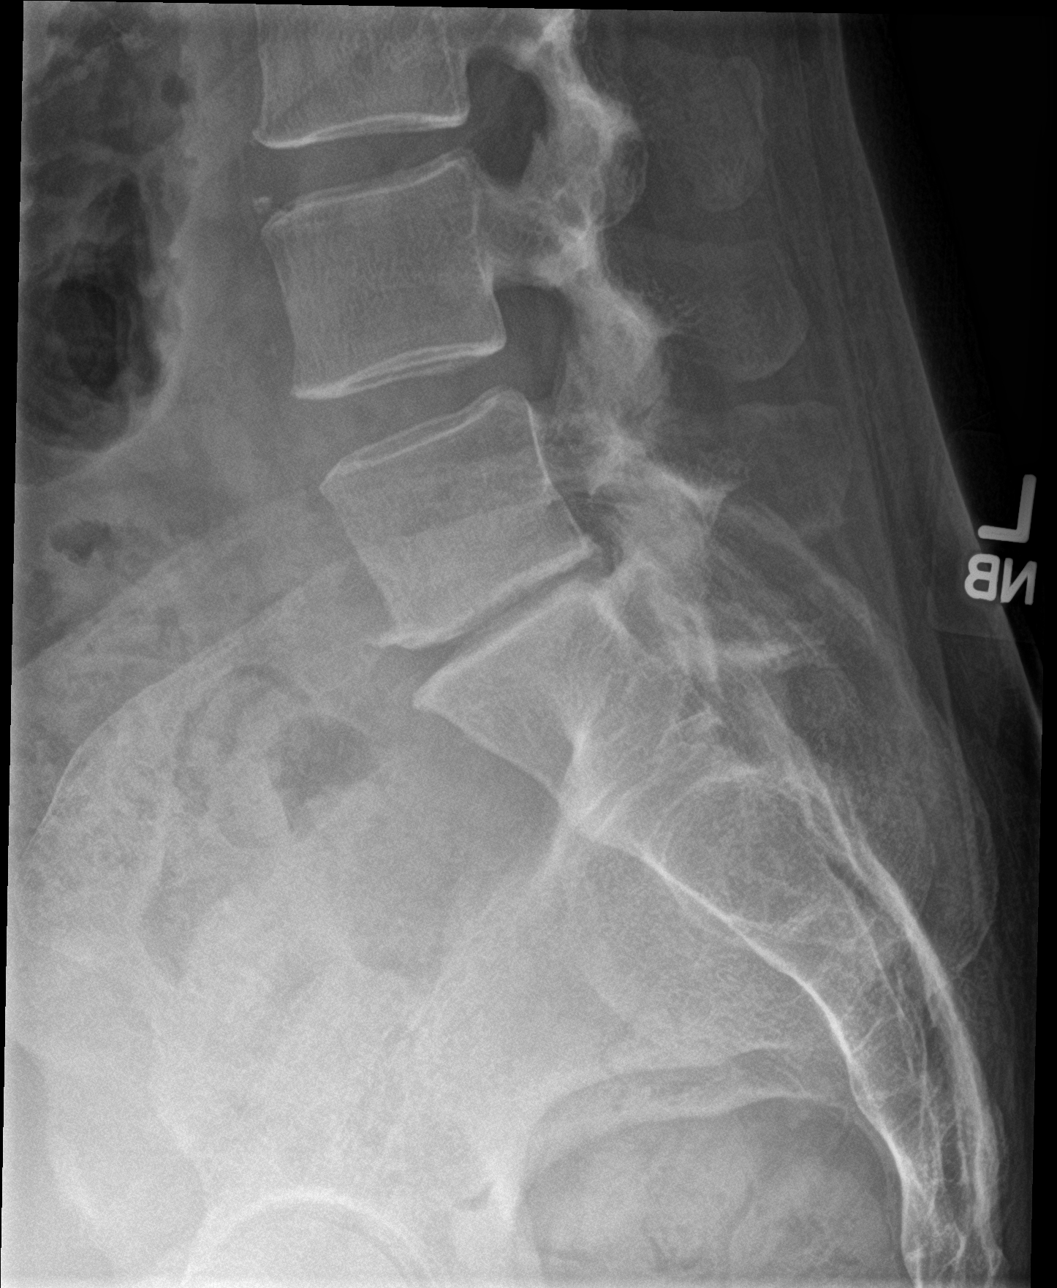

[5 of 5 positions shown; findings below may reference images not displayed]

FINDINGS: Vertebral body height is well maintained. No pars defects are noted.
No anterolisthesis is seen. Mild disc space narrowing at the
lumbosacral junction is noted progressed in the interval from the
prior exam. Small limbus vertebra is noted and stable.
IMPRESSION: Mild degenerative change without acute abnormality.

## 2020-08-06 IMAGING — DX DG CERVICAL SPINE COMPLETE 4+V
6 series · 6 of 6 positions shown · non-contrast
Comparison: None.

CLINICAL DATA: Right-sided neck pain, no known injury, initial
encounter

EXAM:
CERVICAL SPINE - COMPLETE 4+ VIEW

[c-spine lat]
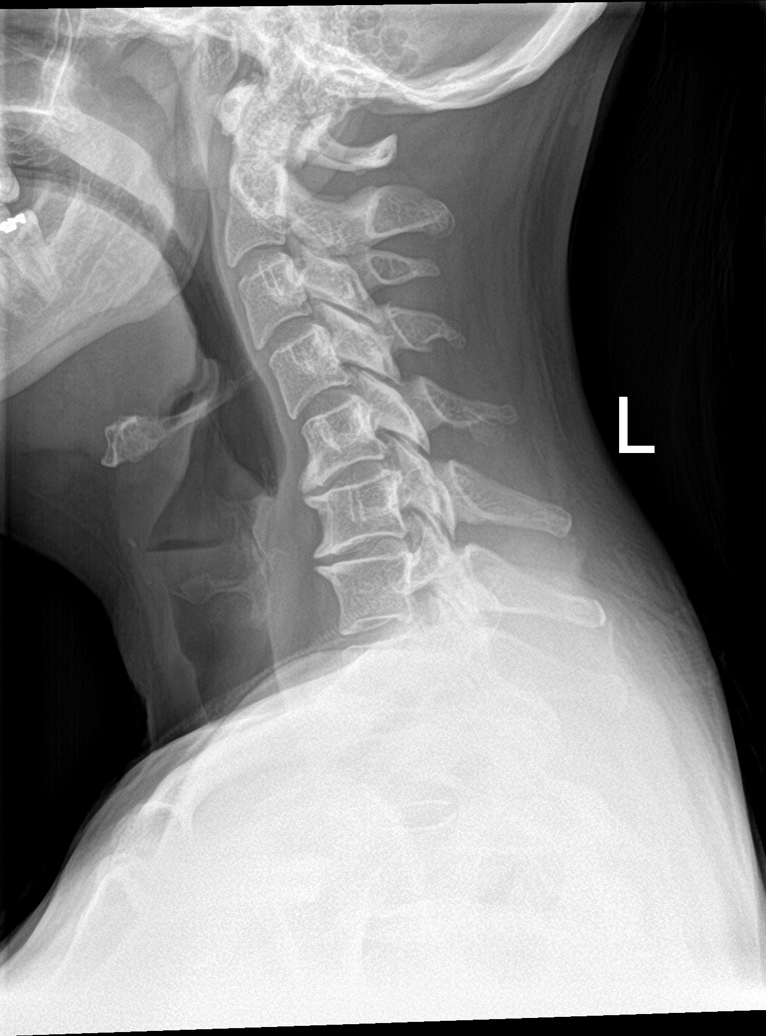

[c-spine obl (1 of 2)]
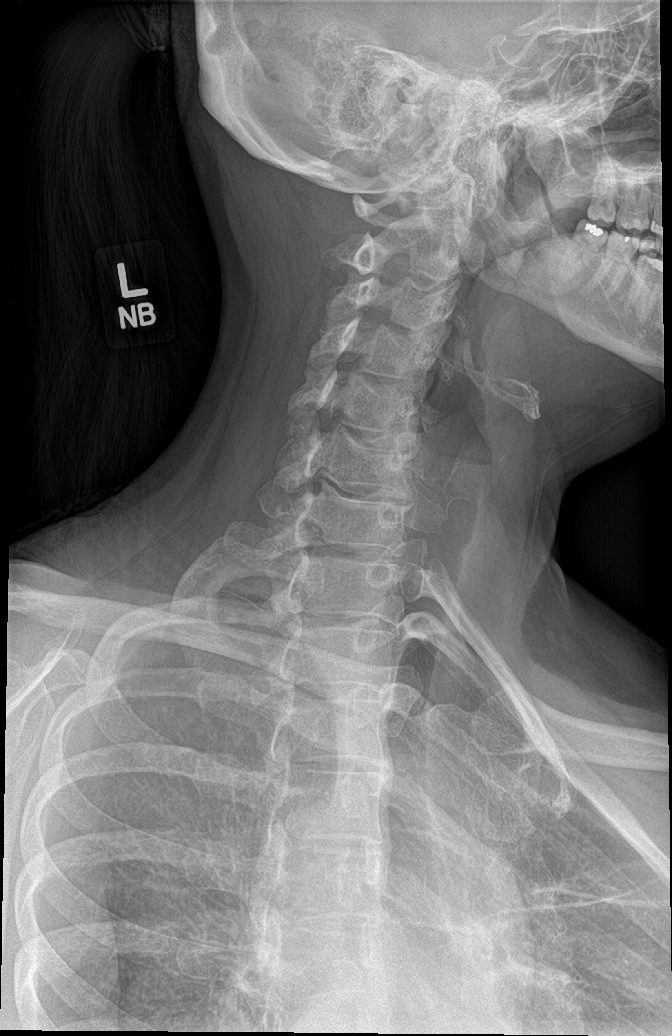

[c-spine obl (2 of 2)]
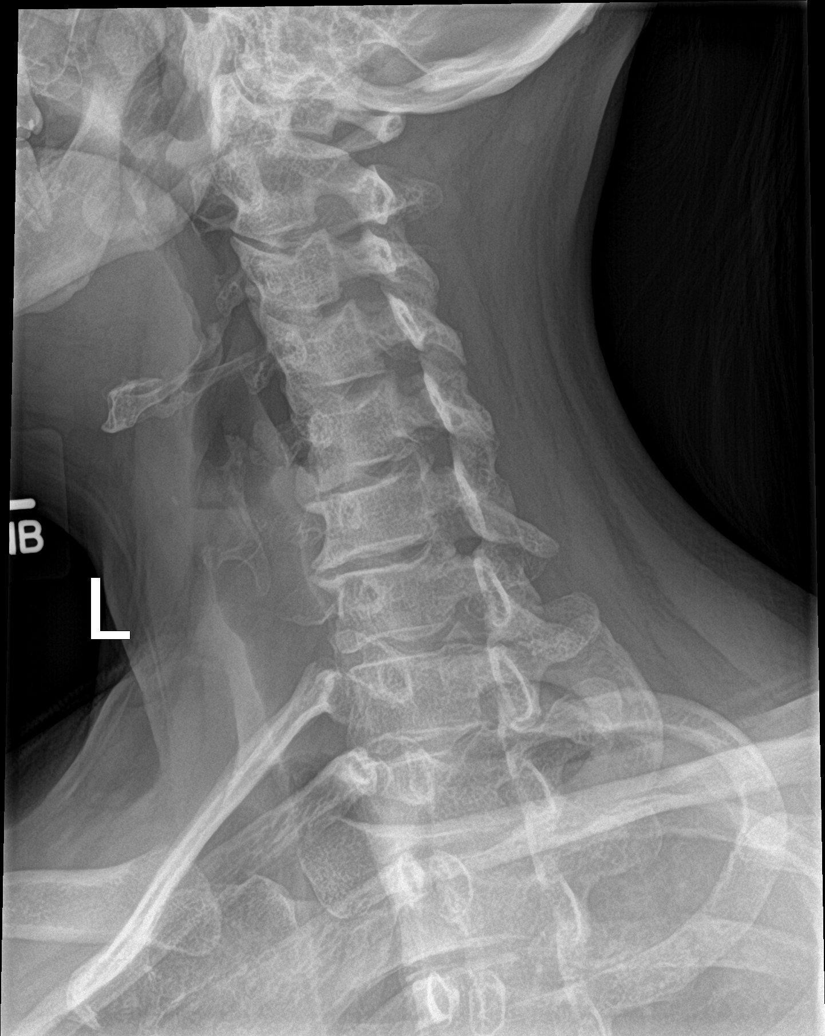

[c-spine ap]
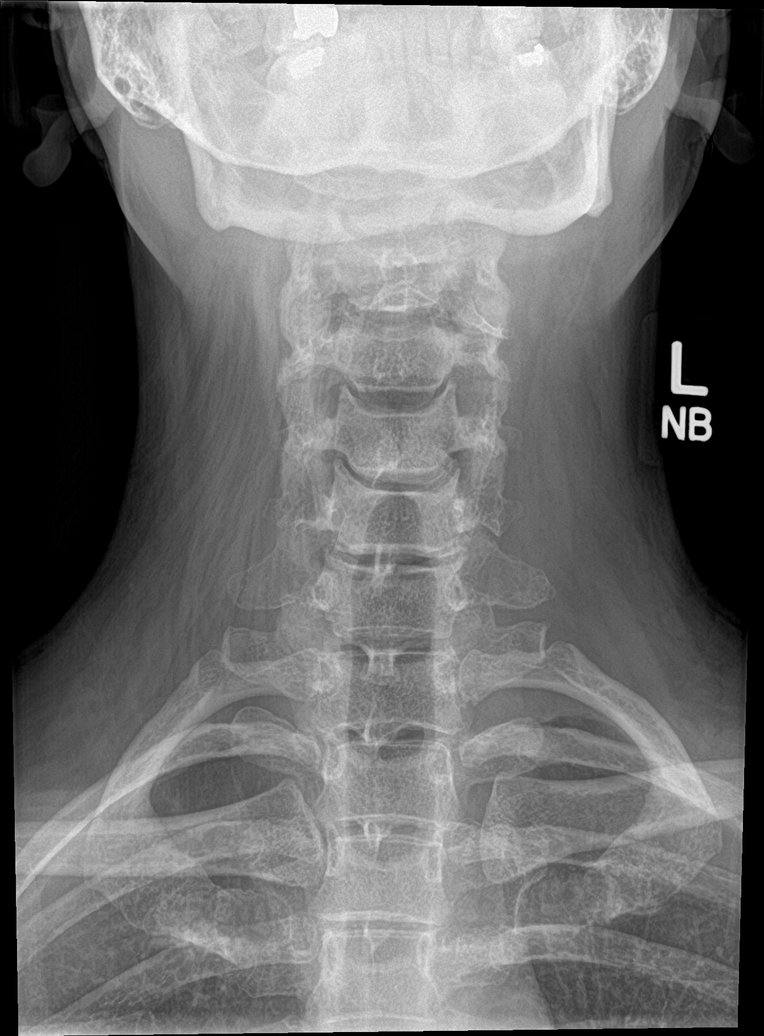

[c-spine open mouth]
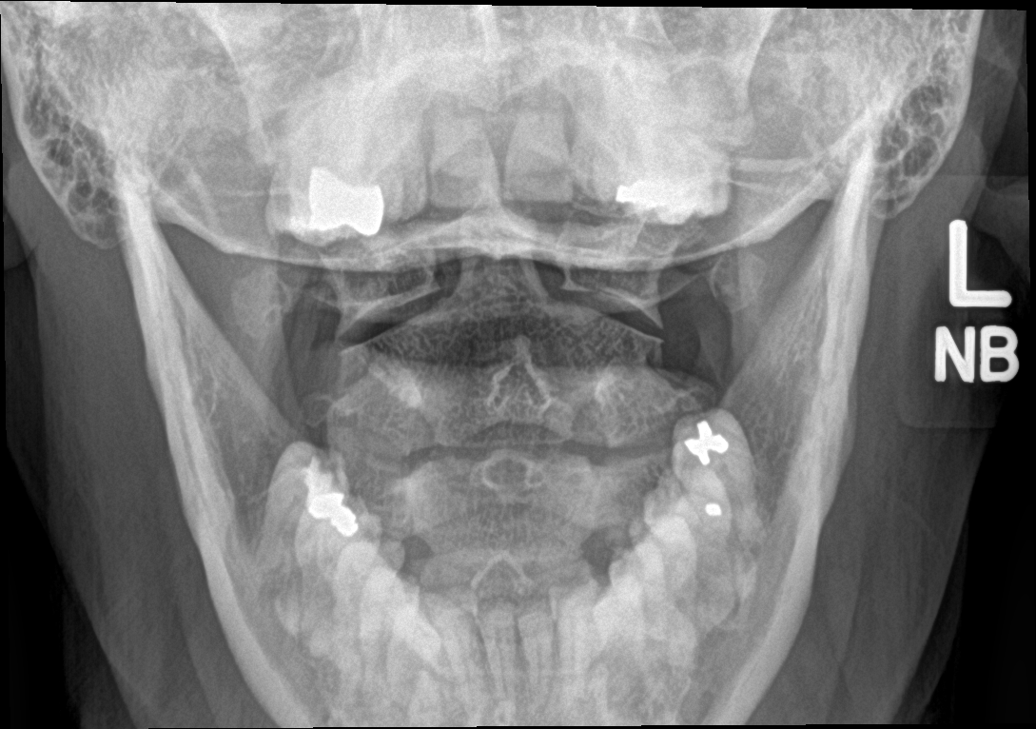

[c-spine swimmers]
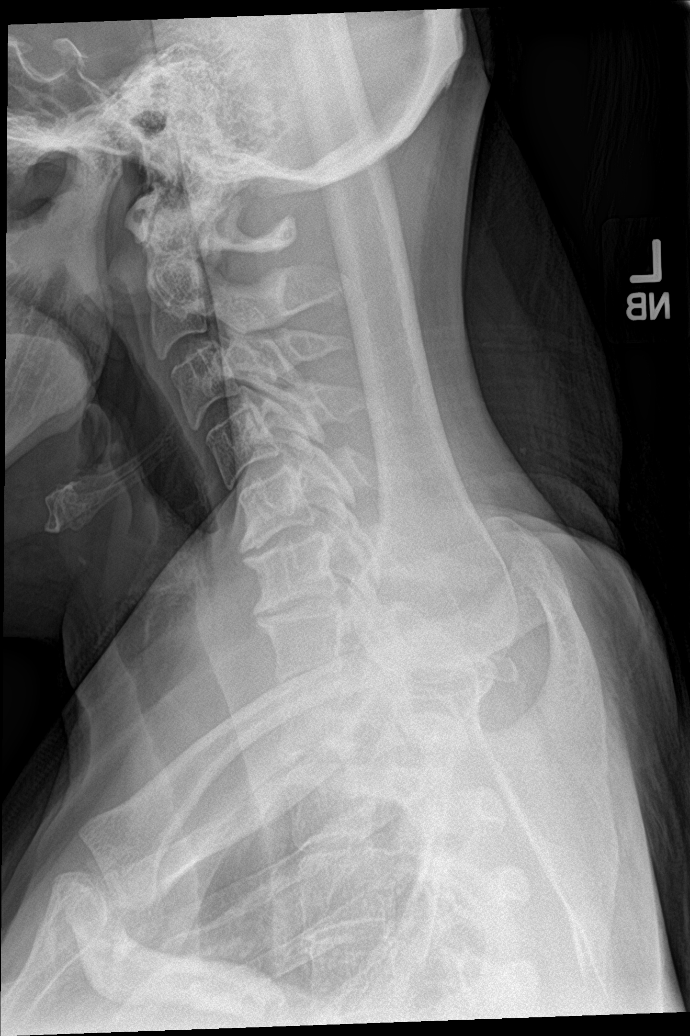

[6 of 6 positions shown; findings below may reference images not displayed]

FINDINGS: Seven cervical segments are well visualized. Mild loss of the normal
cervical lordosis is noted. Degenerative osteophytic changes are
noted at C5-6 and C6-7. No prevertebral soft tissue changes are
noted. Mild neural foraminal narrowing is noted at C6-7 worse on the
left than the right. No other bony abnormality is seen.
IMPRESSION: Mild degenerative change without acute abnormality.

## 2020-08-06 MED ORDER — PREDNISONE 50 MG PO TABS: ORAL_TABLET | ORAL | 0 refills | Status: DC

## 2020-08-06 MED ORDER — PREGABALIN 75 MG PO CAPS: 75.0000 mg | ORAL_CAPSULE | Freq: Two times a day (BID) | ORAL | 3 refills | Status: DC
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
## 2020-08-06 NOTE — Assessment & Plan Note (Signed)
Has also had low back pain with radiation down both legs particularly with prolonged sitting. 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 Because she has already failed formal therapy and conservative treatment we are going to proceed with an updated x-ray and MRI for epidural planning. Return to see me to go over results.

## 2020-08-07 LAB — BASIC METABOLIC PANEL WITH GFR
BUN/Creatinine Ratio: 7 (calc) (ref 6–22)
BUN: 5 mg/dL — ABNORMAL LOW (ref 7–25)
CO2: 23 mmol/L (ref 20–32)
Calcium: 9.3 mg/dL (ref 8.6–10.2)
Chloride: 108 mmol/L (ref 98–110)
Creat: 0.75 mg/dL (ref 0.50–1.10)
GFR, Est African American: 112 mL/min/{1.73_m2} (ref >=60)
GFR, Est Non African American: 97 mL/min/{1.73_m2} (ref >=60)
Glucose, Bld: 83 mg/dL (ref 65–139)
Potassium: 4.1 mmol/L (ref 3.5–5.3)
Sodium: 139 mmol/L (ref 135–146)

## 2020-08-26 ENCOUNTER — Ambulatory Visit (INDEPENDENT_AMBULATORY_CARE_PROVIDER_SITE_OTHER): Payer: PRIVATE HEALTH INSURANCE

## 2020-08-26 ENCOUNTER — Other Ambulatory Visit: Payer: Self-pay

## 2020-08-26 DIAGNOSIS — M5412 Radiculopathy, cervical region: Secondary | ICD-10-CM | POA: Diagnosis not present

## 2020-08-26 DIAGNOSIS — M5126 Other intervertebral disc displacement, lumbar region: Secondary | ICD-10-CM | POA: Diagnosis not present

## 2020-08-26 IMAGING — MR MR LUMBAR SPINE W/O CM
4 of 5 series · 24 of 48 positions shown · IV contrast (gadavist)
Comparison: [DATE] and prior.

CLINICAL DATA: Lumbar radiculopathy, bilateral lower extremity
radiculopathy, epidural planning; Cervical radiculopathy, question
residual disc osteophyte complex impinging the right C7 nerve root

EXAM:
MRI CERVICAL SPINE WITHOUT AND WITH CONTRAST
MRI LUMBAR SPINE WITHOUT CONTRAST
TECHNIQUE: Multiplanar and multiecho pulse sequences of the cervical spine, to
include the craniocervical junction and cervicothoracic junction,
and lumbar spine, were obtained without intravenous contrast.
Multiplanar post contrast imaging of the cervical spine was
obtained.
CONTRAST:  7.5mL GADAVIST GADOBUTROL 1 MMOL/ML IV SOLN

[Series 3: T2 · sagittal · 4.0mm · 0.81mm/px · 6 of 15 slices shown (1 of 2)]
[im 1/15]
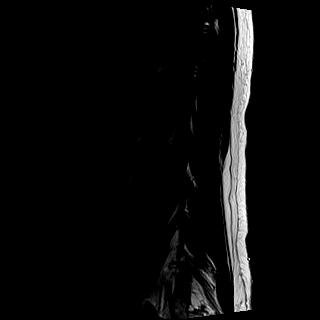
[im 3/15]
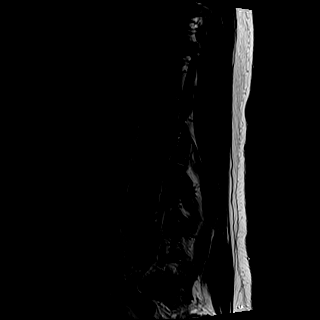
[im 6/15]
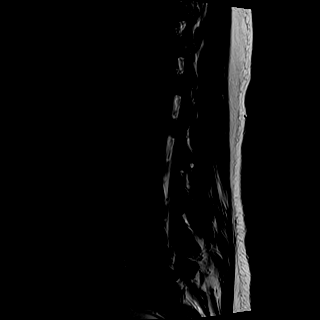
[im 9/15]
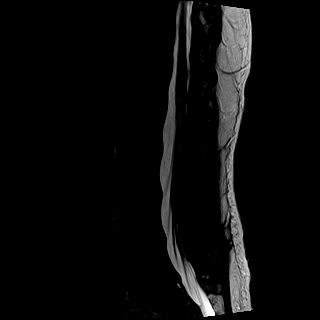
[im 12/15]
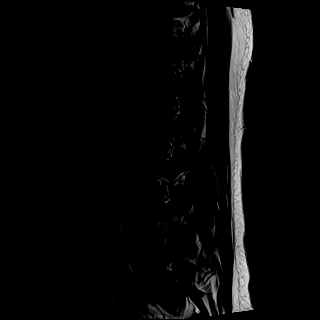
[im 15/15]
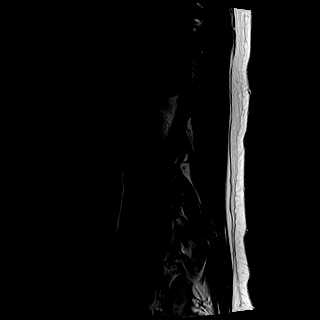

[Series 4: T1 · sagittal · 4.0mm · 0.41mm/px · 6 of 15 slices shown (1 of 2)]
[im 1/15]
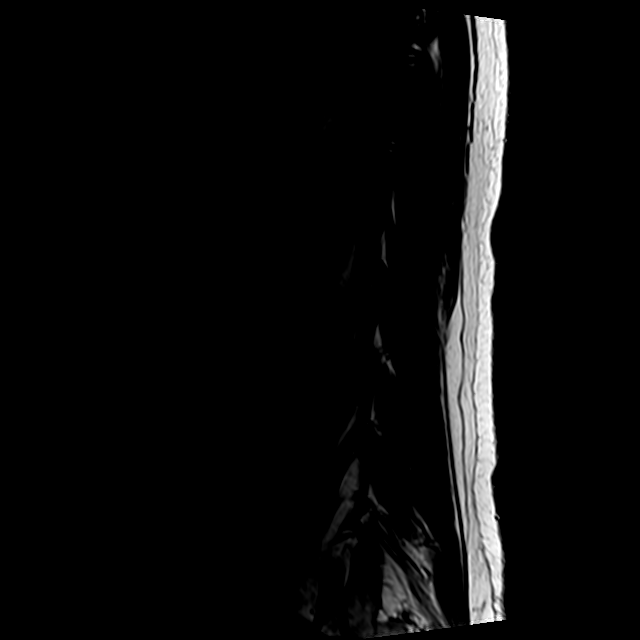
[im 3/15]
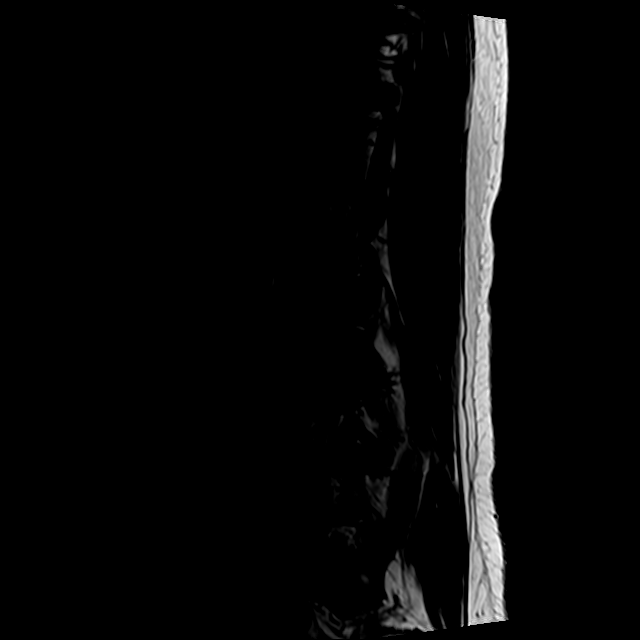
[im 6/15]
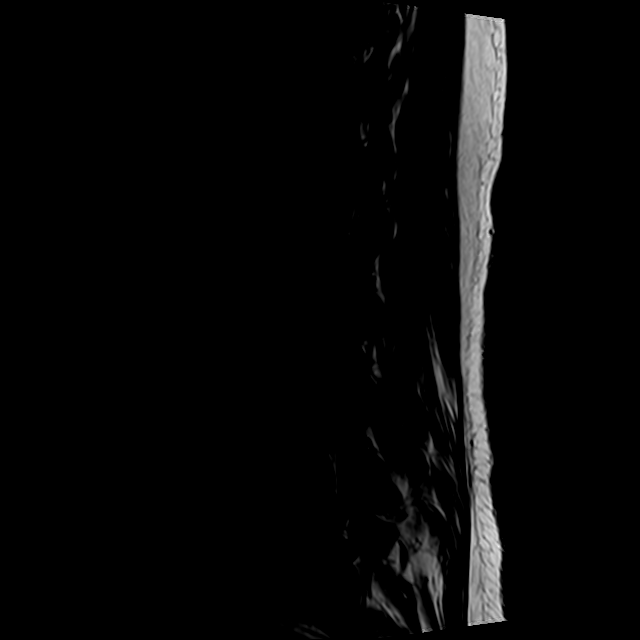
[im 9/15]
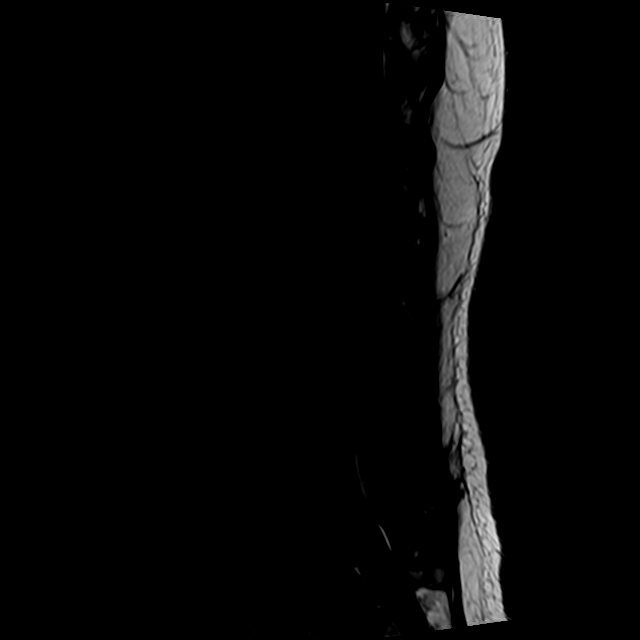
[im 12/15]
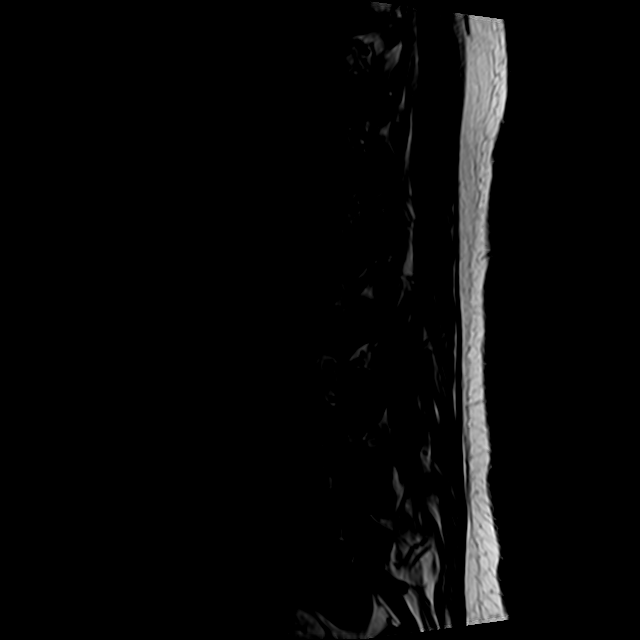
[im 15/15]
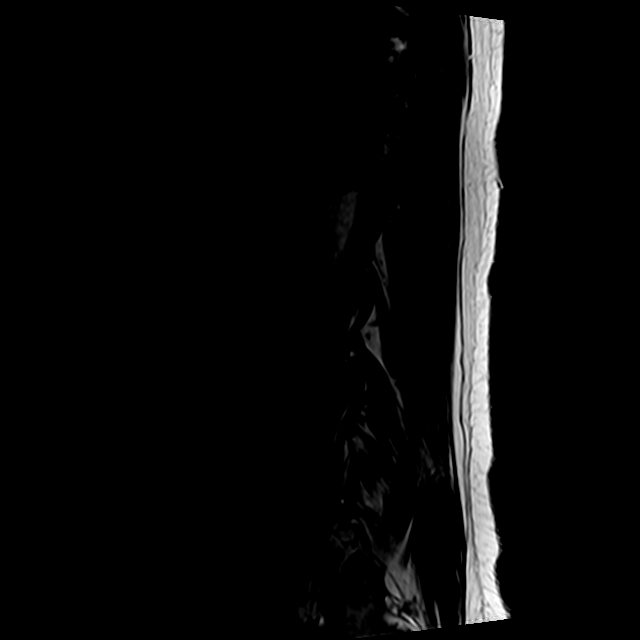

[Series 6: T2 · axial · 4.0mm · 0.78mm/px · z∈[-72,+151]mm · 9 of 40 slices shown (2 of 2)]
[im 1/40]
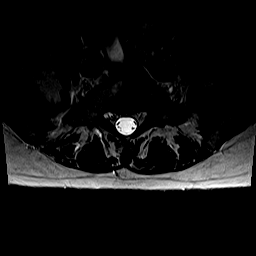
[im 6/40]
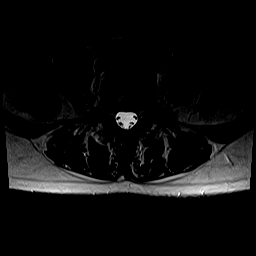
[im 12/40]
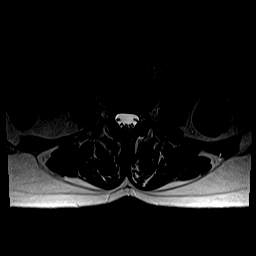
[im 17/40]
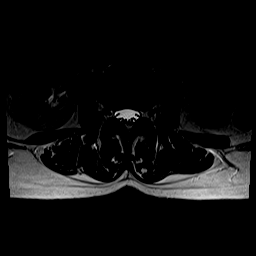
[im 20/40]
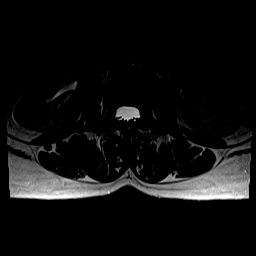
[im 23/40]
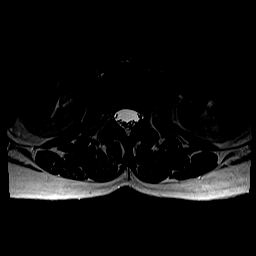
[im 28/40]
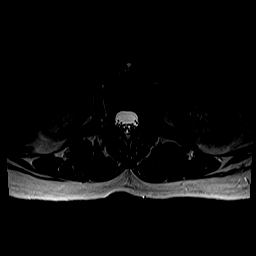
[im 34/40]
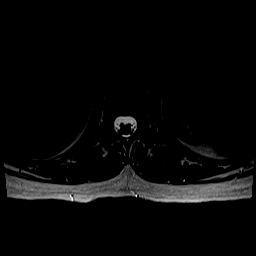
[im 40/40]
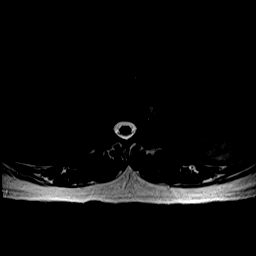

[Series 7: T1 · axial · 4.0mm · 0.39mm/px · z∈[-48,+121]mm · 3 of 40 slices shown (2 of 2)]
[im 6/40]
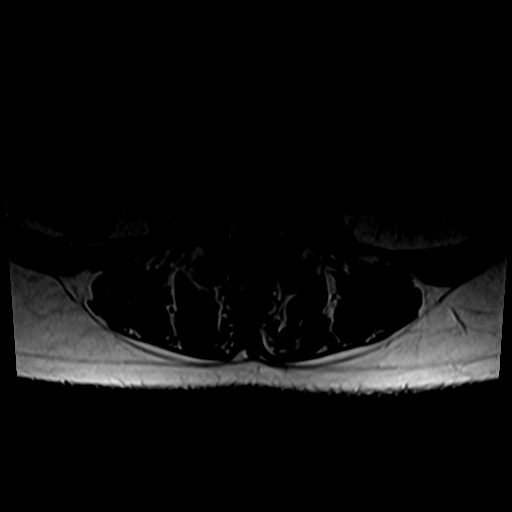
[im 20/40]
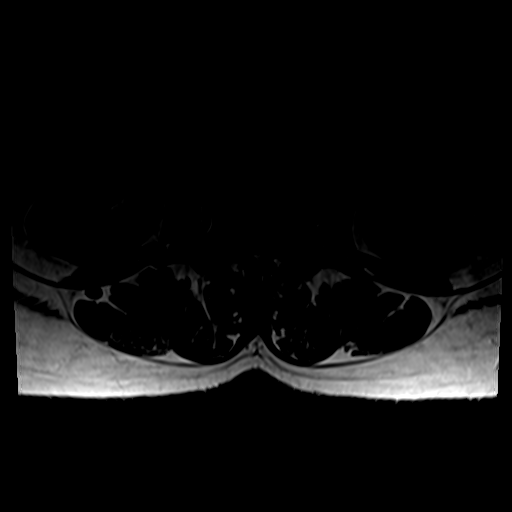
[im 34/40]
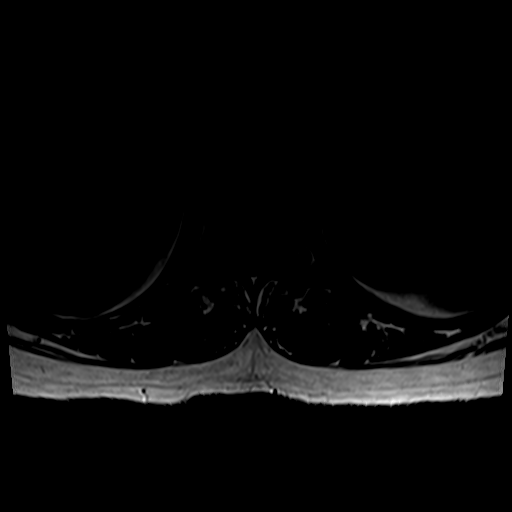

[24 of 48 positions shown; findings below may reference images not displayed]

FINDINGS: MRI CERVICAL SPINE FINDINGS

Alignment: Straightening of lordosis.

Vertebrae: Normal bone marrow signal intensity. No fracture or
aggressive osseous lesion.

Cord: Normal signal and morphology.  No abnormal enhancement.

Posterior Fossa, vertebral arteries: Negative.

Disc levels: Multilevel desiccation.

C2-3: Shallow central protrusion with bilateral facet degenerative
spurring. Patent spinal canal and neural foramen.

C3-4: Small disc osteophyte complex with uncovertebral/facet
degenerative spurring. Shallow central protrusion. Patent spinal
canal and neural foramen.

C4-5: Disc osteophyte complex with left foraminal protrusion.
Uncovertebral and facet degenerative spurring. Patent right neural
foramen. Mild spinal canal and left neural foraminal narrowing.

C5-6: Disc osteophyte complex abutting the ventral cord with
uncovertebral and facet degenerative spurring. Mild spinal canal and
bilateral neural foraminal narrowing.

C6-7: Disc osteophyte complex with superimposed right subarticular
protrusion abutting/flattening the ventral cord. Sequela of right
laminotomy. Uncovertebral and facet degenerative spurring. Mild
spinal canal and right greater than left neural foraminal narrowing.

C7-T1: Uncovertebral and facet degenerative spurring. Patent spinal
canal and neural foramen.

Paraspinal tissues: Postsurgical appearance of the paraspinal soft
tissues.

MRI LUMBAR SPINE FINDINGS

Segmentation:  Standard.

Alignment:  Straightening of lordosis.  Trace L5-S1 retrolisthesis.

Vertebrae: L5-S1 Modic type 2 endplate degenerative changes. L1
hemangioma. No fracture or aggressive osseous lesion.

Conus medullaris and cauda equina: Conus extends to the L1 level.
Conus and cauda equina appear normal.

Disc levels: Desiccation and disc space loss most prominent at the
L5-S1 level.

L1-2: No significant disc bulge. Patent spinal canal and neural
foramen.

L2-3: No significant disc bulge. Patent spinal canal and neural
foramen.

L3-4: No significant disc bulge. Facet degenerative spurring. Patent
spinal canal and neural foramen.

L4-5: Minimal disc bulge and bilateral facet degenerative spurring.
Patent spinal canal and neural foramen.

L5-S1: Disc bulge and bilateral facet hypertrophy. Abutment of the
descending S1 nerve roots. Patent spinal canal. Mild bilateral
neural foraminal narrowing.

Paraspinal and other soft tissues: Negative.
IMPRESSION: MRI cervical spine:

Right C6-7 subarticular protrusion flattening the ventral cord with
mild spinal canal and mild right greater than left neural foraminal
narrowing.

Mild C4-6 spinal canal narrowing.

Mild left C4-5 and bilateral C5-6 neural foraminal narrowing.

MRI lumbar spine:

L5-S1 disc bulge abutting the descending S1 nerve roots with mild
bilateral neural foraminal narrowing.

No significant spinal canal narrowing.

## 2020-08-26 IMAGING — MR MR CERVICAL SPINE WO/W CM
8 series · 44 of 48 positions shown · IV contrast (gadavist)
Comparison: [DATE] and prior.

CLINICAL DATA: Lumbar radiculopathy, bilateral lower extremity
radiculopathy, epidural planning; Cervical radiculopathy, question
residual disc osteophyte complex impinging the right C7 nerve root

EXAM:
MRI CERVICAL SPINE WITHOUT AND WITH CONTRAST
MRI LUMBAR SPINE WITHOUT CONTRAST
TECHNIQUE: Multiplanar and multiecho pulse sequences of the cervical spine, to
include the craniocervical junction and cervicothoracic junction,
and lumbar spine, were obtained without intravenous contrast.
Multiplanar post contrast imaging of the cervical spine was
obtained.
CONTRAST:  7.5mL GADAVIST GADOBUTROL 1 MMOL/ML IV SOLN

[Series 3: T2 · sagittal · 3.0mm · 0.69mm/px · 4 of 13 slices shown (1 of 2)]
[im 1/13]
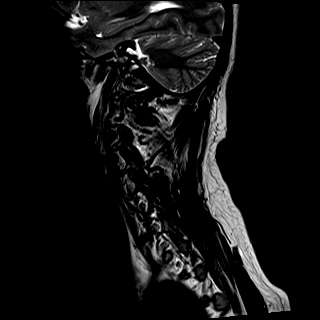
[im 5/13]
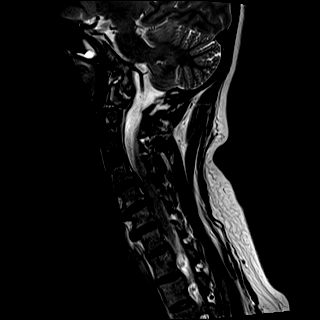
[im 9/13]
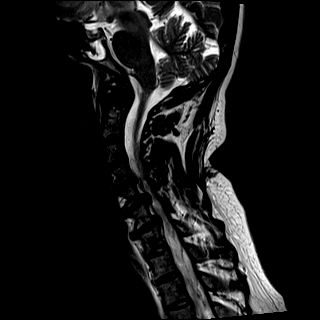
[im 13/13]
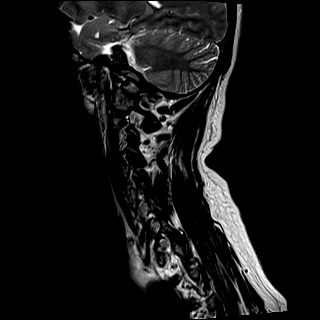

[Series 4: T1 · sagittal · 3.0mm · 0.86mm/px · 4 of 13 slices shown (1 of 2)]
[im 1/13]
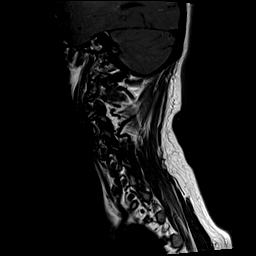
[im 5/13]
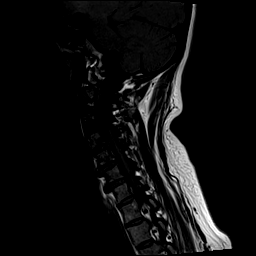
[im 9/13]
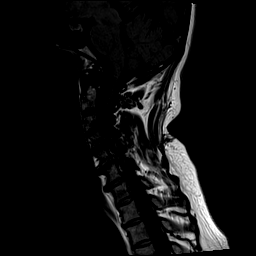
[im 13/13]
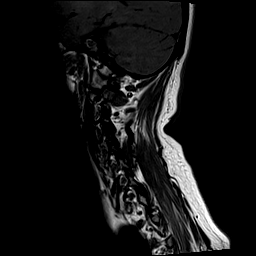

[Series 5: STIR · sagittal · 3.0mm · 0.69mm/px · 4 of 13 slices shown]
[im 1/13]
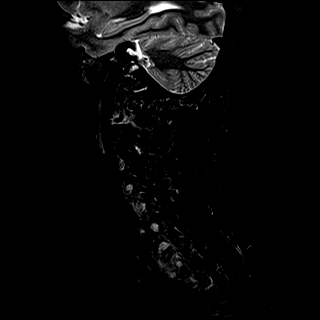
[im 5/13]
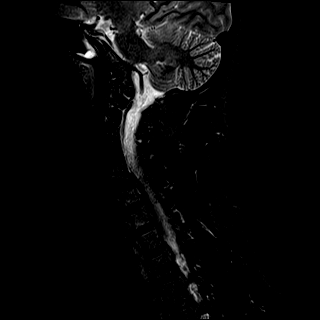
[im 9/13]
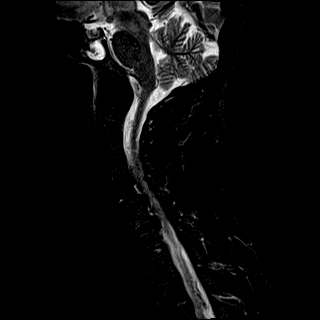
[im 13/13]
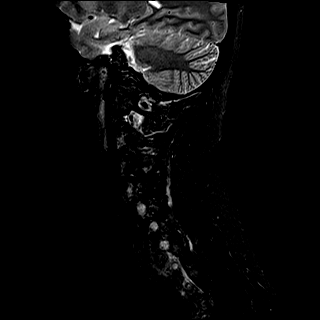

[Series 6: T2 · axial · 3.0mm · 0.62mm/px · z∈[+345,+445]mm · 8 of 28 slices shown (2 of 2)]
[im 1/28]
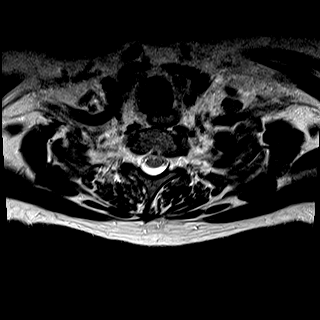
[im 4/28]
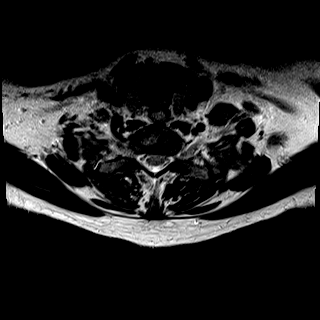
[im 8/28]
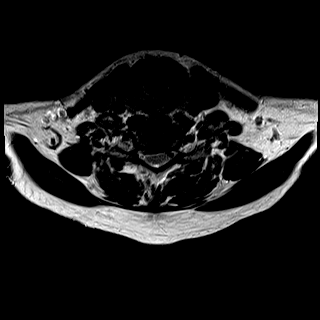
[im 12/28]
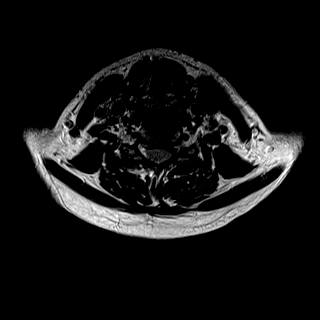
[im 16/28]
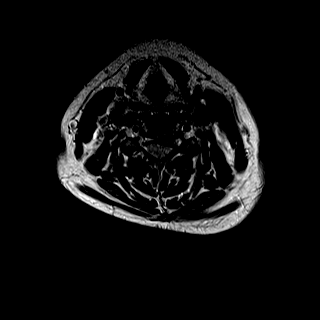
[im 20/28]
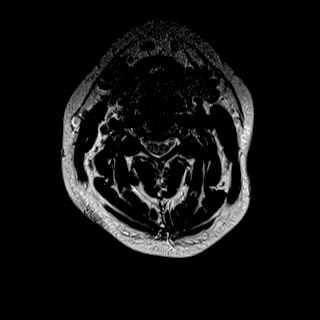
[im 24/28]
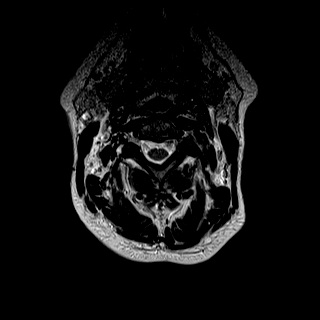
[im 28/28]
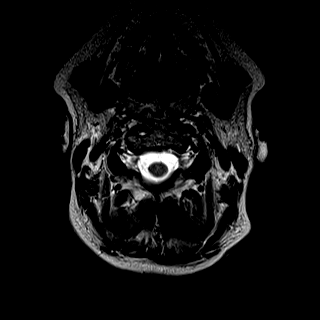

[Series 7: mpgr ax · axial · 3.0mm · 0.35mm/px · z∈[+355,+396]mm · 4 of 28 slices shown]
[im 1/28]
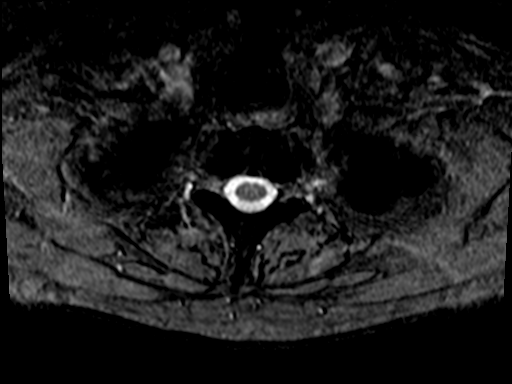
[im 4/28]
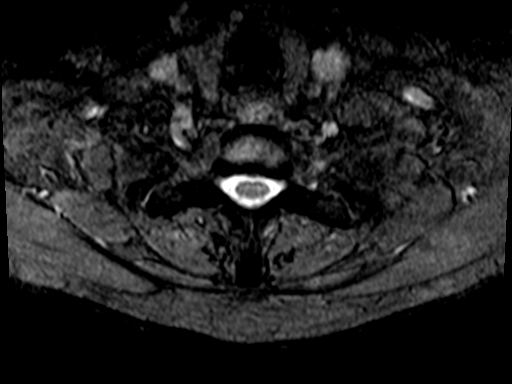
[im 8/28]
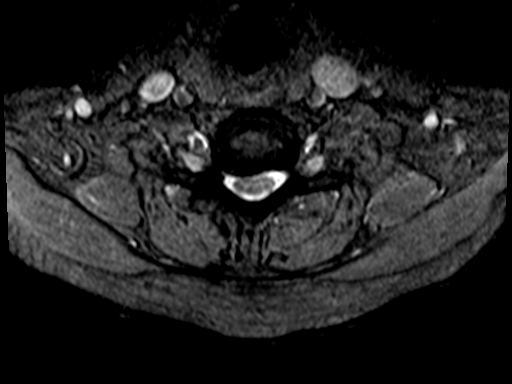
[im 12/28]
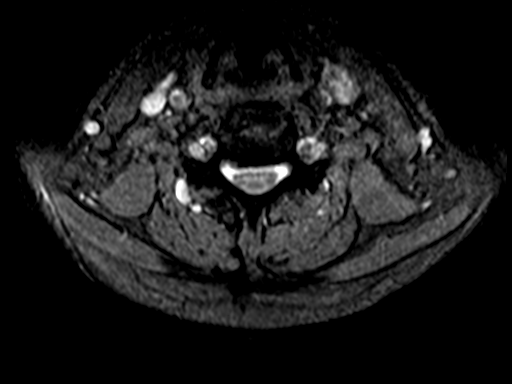

[Series 8: T1 · axial · non-contrast · 3.0mm · 0.78mm/px · z∈[+345,+445]mm · 8 of 28 slices shown (2 of 2)]
[im 1/28]
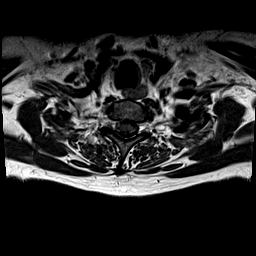
[im 4/28]
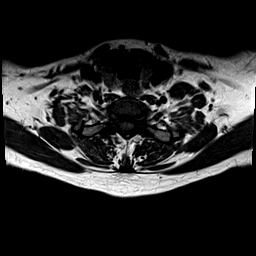
[im 8/28]
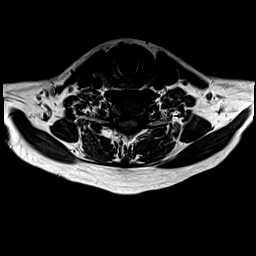
[im 12/28]
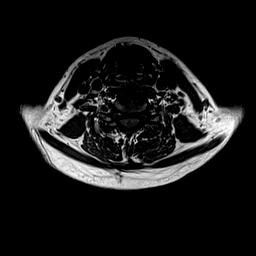
[im 16/28]
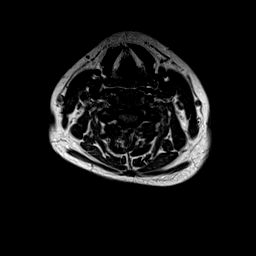
[im 20/28]
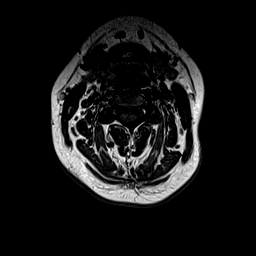
[im 24/28]
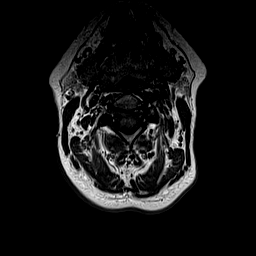
[im 28/28]
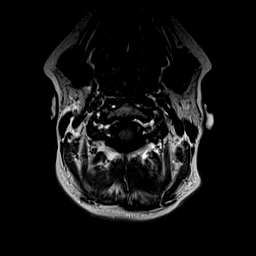

[Series 9: T1 fat-sat post-contrast · sagittal · 3.0mm · 0.69mm/px · 4 of 13 slices shown]
[im 1/13]
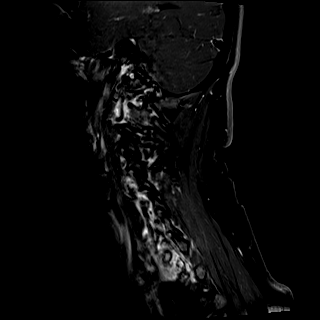
[im 5/13]
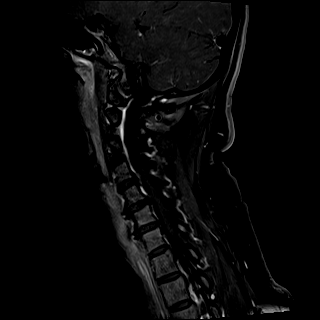
[im 9/13]
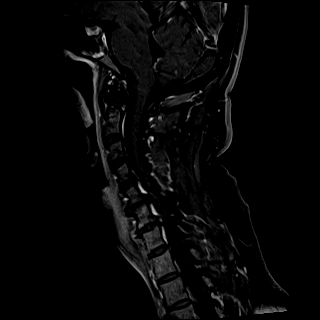
[im 13/13]
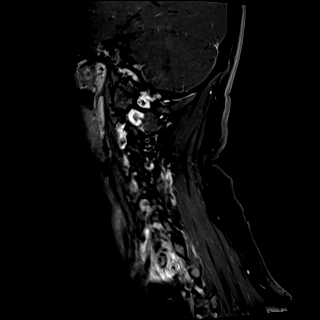

[Series 10: T1 post-contrast · axial · 3.0mm · 0.78mm/px · z∈[+345,+445]mm · 8 of 28 slices shown]
[im 1/28]
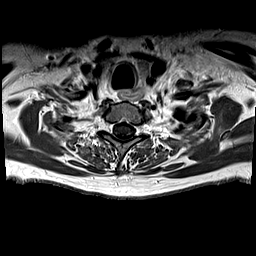
[im 4/28]
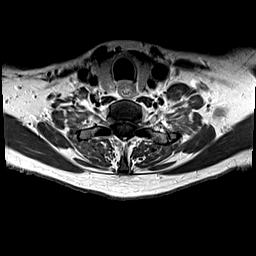
[im 8/28]
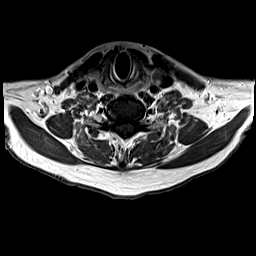
[im 12/28]
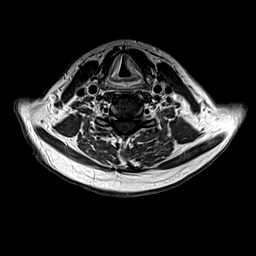
[im 16/28]
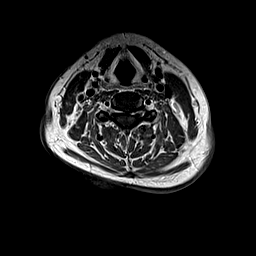
[im 20/28]
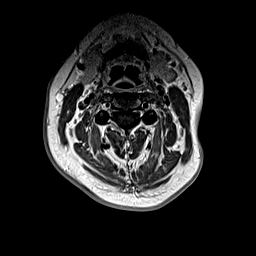
[im 24/28]
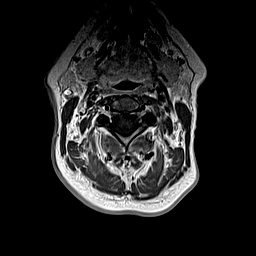
[im 28/28]
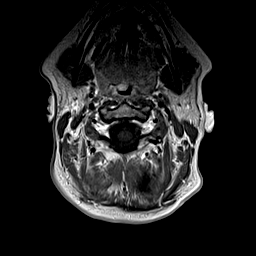

[44 of 48 positions shown; findings below may reference images not displayed]

FINDINGS: MRI CERVICAL SPINE FINDINGS

Alignment: Straightening of lordosis.

Vertebrae: Normal bone marrow signal intensity. No fracture or
aggressive osseous lesion.

Cord: Normal signal and morphology.  No abnormal enhancement.

Posterior Fossa, vertebral arteries: Negative.

Disc levels: Multilevel desiccation.

C2-3: Shallow central protrusion with bilateral facet degenerative
spurring. Patent spinal canal and neural foramen.

C3-4: Small disc osteophyte complex with uncovertebral/facet
degenerative spurring. Shallow central protrusion. Patent spinal
canal and neural foramen.

C4-5: Disc osteophyte complex with left foraminal protrusion.
Uncovertebral and facet degenerative spurring. Patent right neural
foramen. Mild spinal canal and left neural foraminal narrowing.

C5-6: Disc osteophyte complex abutting the ventral cord with
uncovertebral and facet degenerative spurring. Mild spinal canal and
bilateral neural foraminal narrowing.

C6-7: Disc osteophyte complex with superimposed right subarticular
protrusion abutting/flattening the ventral cord. Sequela of right
laminotomy. Uncovertebral and facet degenerative spurring. Mild
spinal canal and right greater than left neural foraminal narrowing.

C7-T1: Uncovertebral and facet degenerative spurring. Patent spinal
canal and neural foramen.

Paraspinal tissues: Postsurgical appearance of the paraspinal soft
tissues.

MRI LUMBAR SPINE FINDINGS

Segmentation:  Standard.

Alignment:  Straightening of lordosis.  Trace L5-S1 retrolisthesis.

Vertebrae: L5-S1 Modic type 2 endplate degenerative changes. L1
hemangioma. No fracture or aggressive osseous lesion.

Conus medullaris and cauda equina: Conus extends to the L1 level.
Conus and cauda equina appear normal.

Disc levels: Desiccation and disc space loss most prominent at the
L5-S1 level.

L1-2: No significant disc bulge. Patent spinal canal and neural
foramen.

L2-3: No significant disc bulge. Patent spinal canal and neural
foramen.

L3-4: No significant disc bulge. Facet degenerative spurring. Patent
spinal canal and neural foramen.

L4-5: Minimal disc bulge and bilateral facet degenerative spurring.
Patent spinal canal and neural foramen.

L5-S1: Disc bulge and bilateral facet hypertrophy. Abutment of the
descending S1 nerve roots. Patent spinal canal. Mild bilateral
neural foraminal narrowing.

Paraspinal and other soft tissues: Negative.
IMPRESSION: MRI cervical spine:

Right C6-7 subarticular protrusion flattening the ventral cord with
mild spinal canal and mild right greater than left neural foraminal
narrowing.

Mild C4-6 spinal canal narrowing.

Mild left C4-5 and bilateral C5-6 neural foraminal narrowing.

MRI lumbar spine:

L5-S1 disc bulge abutting the descending S1 nerve roots with mild
bilateral neural foraminal narrowing.

No significant spinal canal narrowing.

## 2020-08-26 MED ORDER — GADOBUTROL 1 MMOL/ML IV SOLN
10.0000 mL | Freq: Once | INTRAVENOUS | Status: AC | PRN
  Administered 2020-08-26: 7.5 mL via INTRAVENOUS

## 2020-08-26 MED ORDER — GADOBUTROL 1 MMOL/ML IV SOLN
7.5000 mL | Freq: Once | INTRAVENOUS | Status: AC | PRN
  Administered 2020-08-26: 7.5 mL via INTRAVENOUS

## 2020-08-28 ENCOUNTER — Other Ambulatory Visit: Payer: Self-pay

## 2020-08-28 ENCOUNTER — Ambulatory Visit (INDEPENDENT_AMBULATORY_CARE_PROVIDER_SITE_OTHER): Payer: PRIVATE HEALTH INSURANCE | Admitting: Sports Medicine

## 2020-08-28 DIAGNOSIS — M5412 Radiculopathy, cervical region: Secondary | ICD-10-CM | POA: Diagnosis not present

## 2020-08-28 DIAGNOSIS — M5416 Radiculopathy, lumbar region: Secondary | ICD-10-CM | POA: Diagnosis not present

## 2020-08-28 NOTE — Assessment & Plan Note (Signed)
Right-sided lumbar radicular symptoms, MRI personally reviewed and there is a fairly large L5-S1 disc retrusion causing biforaminal stenosis at the L5-S1 level, looks to be compressing the L5 nerve roots bilaterally, we will start with a right L5-S1 transforaminal epidural.

## 2020-08-28 NOTE — Progress Notes (Signed)
    Procedures performed today:    None.  Independent interpretation of notes and tests performed by another provider:   Cervical and lumbar spine MRIs personally reviewed, there is degenerative disc disease at the C5-C6 and C6-C7 level, there certainly does appear to be residual disc herniation at the J8-A4 level certainly affecting the right neuroforamen.  Lumbar spine MRI shows a good sized broad-based disc protrusion at the L5-S1 level causing foraminal stenosis on the right and the left at the L5-S1 level as well.  Brief History, Exam, Impression, and Recommendations:    Right cervical radiculopathy This pleasant 45 year old female returns, long history of right-sided neck and right arm pain, she had nerve conductions, EMGs and what looks like a posterior right-sided decompression at C6-C7 level, unfortunately it looks like there is residual C6-C7 disc affecting the thecal sac as well as the right C7 nerve root. First thing we need to do since there has been failure of conservative treatment is proceed with a right C6-C7 interlaminar epidural. It this is efficacious we may consider referral for anterior cervical discectomy and fusion. I would however like to see her back 1 month after the injection.  Right lumbar radiculopathy Right-sided lumbar radicular symptoms, MRI personally reviewed and there is a fairly large L5-S1 disc retrusion causing biforaminal stenosis at the L5-S1 level, looks to be compressing the L5 nerve roots bilaterally, we will start with a right L5-S1 transforaminal epidural.    ___________________________________________ Ihor Austin. Benjamin Stain, M.D., ABFM., CAQSM. Primary Care and Sports Medicine [place] [facility]  Adjunct Instructor of Family Medicine  [employer]

## 2020-08-28 NOTE — Assessment & Plan Note (Signed)
This pleasant 45 year old female returns, long history of right-sided neck and right arm pain, she had nerve conductions, EMGs and what looks like a posterior right-sided decompression at C6-C7 level, unfortunately it looks like there is residual C6-C7 disc affecting the thecal sac as well as the right C7 nerve root. First thing we need to do since there has been failure of conservative treatment is proceed with a right C6-C7 interlaminar epidural. It this is efficacious we may consider referral for anterior cervical discectomy and fusion. I would however like to see her back 1 month after the injection.

## 2020-08-30 ENCOUNTER — Other Ambulatory Visit: Payer: Self-pay

## 2020-08-30 ENCOUNTER — Ambulatory Visit
Admission: RE | Admit: 2020-08-30 | Discharge: 2020-08-30 | Disposition: A | Discharge status: Home / Self Care | Payer: PRIVATE HEALTH INSURANCE | Source: Ambulatory Visit | Attending: Sports Medicine | Admitting: Sports Medicine

## 2020-08-30 DIAGNOSIS — M5416 Radiculopathy, lumbar region: Secondary | ICD-10-CM

## 2020-08-30 IMAGING — XA DG EPIDURAL NERVE ROOT
2 series · 2 of 2 positions shown · non-contrast
Comparison: none

CLINICAL DATA: Lumbosacral spondylosis without myelopathy with
radiculopathy. Right leg pain. Right L5-S1 transforaminal epidural
injection requested. No prior injections or surgery.

[Series 1: ortho standard · 1 of 1 slices shown (1 of 2)]
[im 1/1]
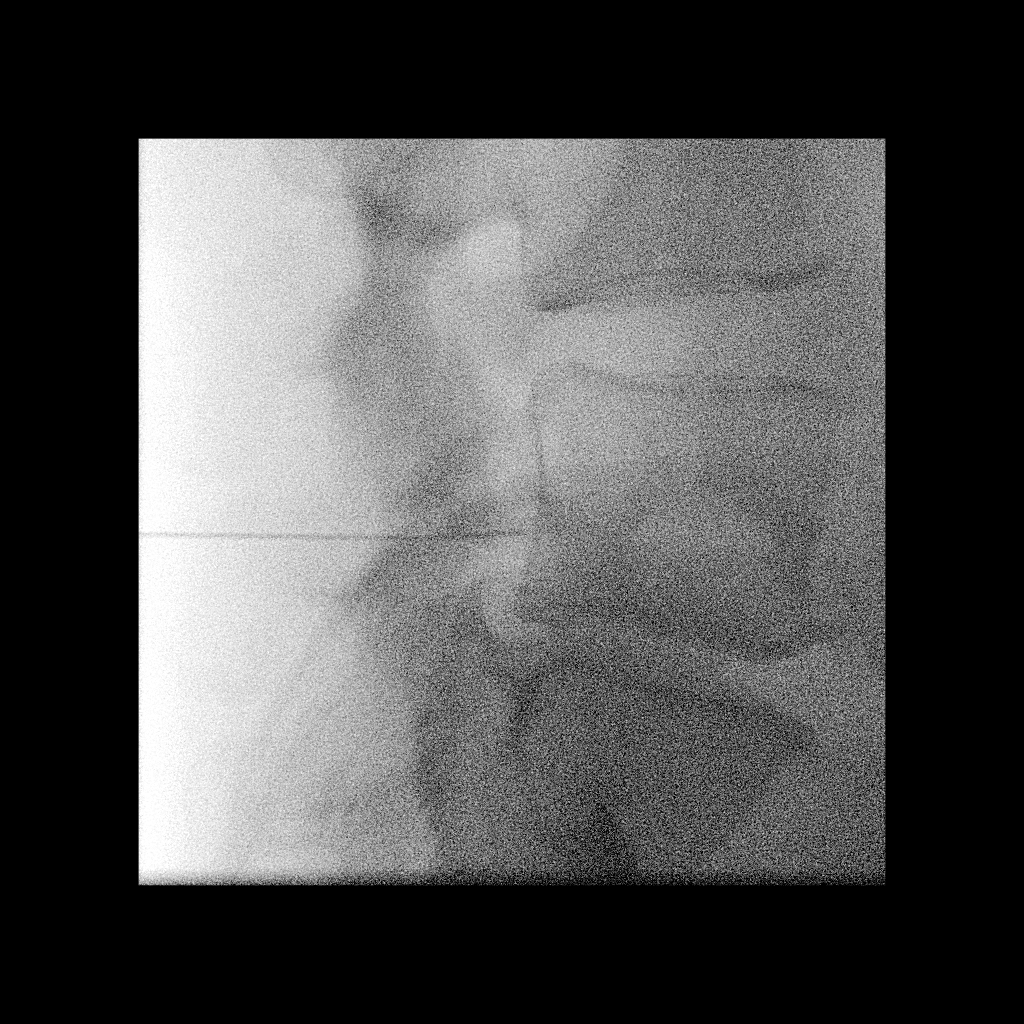

[Series 2: ortho standard · 1 of 1 slices shown (2 of 2)]
[im 1/1]
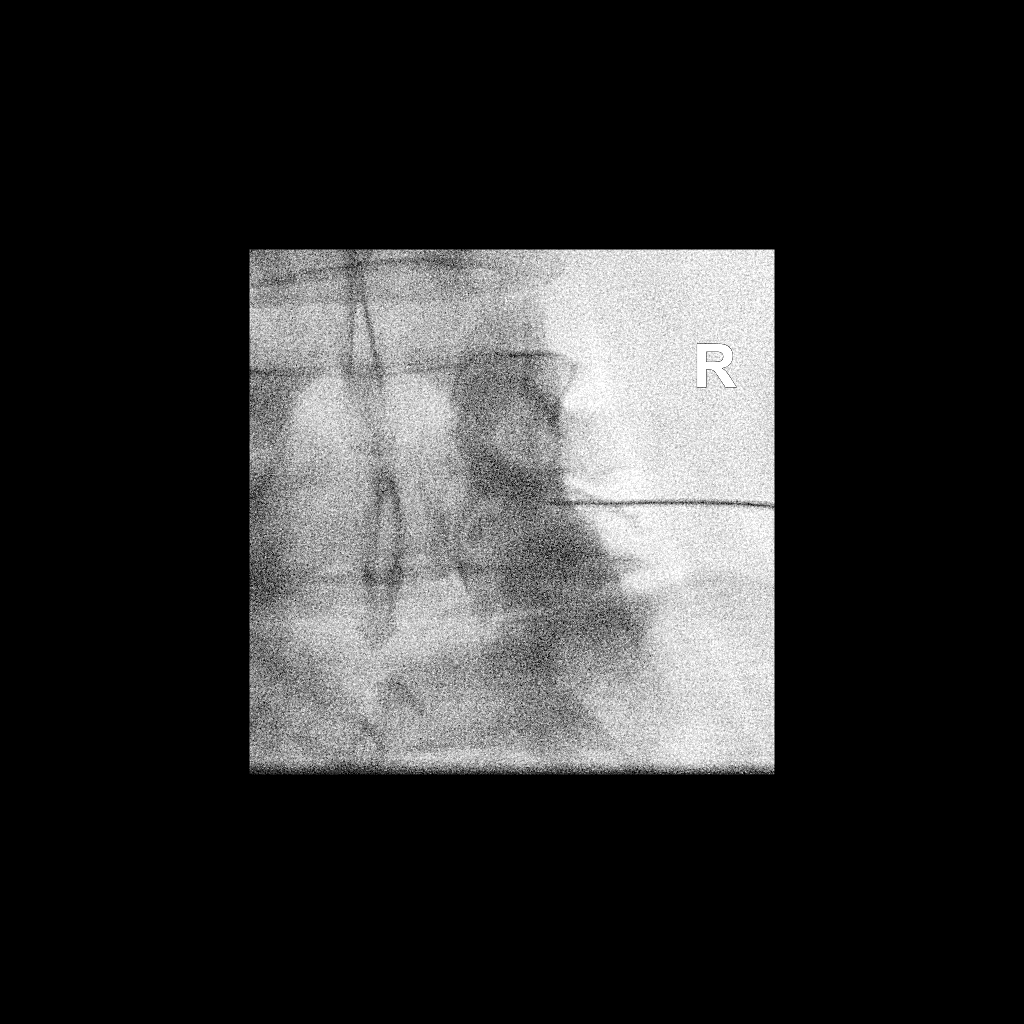

[2 of 2 positions shown; findings below may reference images not displayed]

EXAM:
EPIDURAL/NERVE ROOT

FLUOROSCOPY TIME:  Radiation Exposure Index (as provided by the
fluoroscopic device): 1.9 mGy

Fluoroscopy Time:  22 seconds

Number of Acquired Images:  0

PROCEDURE:
The procedure, risks, benefits, and alternatives were explained to
the patient. Questions regarding the procedure were encouraged and
answered. The patient understands and consents to the procedure.

RIGHT L5 NERVE ROOT BLOCK AND TRANSFORAMINAL EPIDURAL: A posterior
oblique approach was taken to the intervertebral foramen on the
right at L5-S1 using a curved 3.5 inch 22 gauge spinal needle.
Injection of Isovue M 200 outlined the right L5 nerve root and
showed good epidural spread. No vascular opacification is seen. 120
mg of Depo-Medrol mixed with 2 mL of 1% lidocaine were instilled.
The procedure was well-tolerated, and the patient was discharged
thirty minutes following the injection in good condition.

COMPLICATIONS:
None immediate.
IMPRESSION: Technically successful injection consisting of a right L5 nerve root
block and transforaminal epidural.

## 2020-08-30 MED ORDER — TRIAMCINOLONE ACETONIDE 40 MG/ML IJ SUSP (RADIOLOGY)
60.0000 mg | Freq: Once | INTRAMUSCULAR | Status: AC
  Administered 2020-08-30: 60 mg via EPIDURAL

## 2020-08-30 MED ORDER — IOPAMIDOL (ISOVUE-M 300) INJECTION 61%
1.0000 mL | Freq: Once | INTRAMUSCULAR | Status: AC | PRN
  Administered 2020-08-30: 1 mL via EPIDURAL

## 2020-08-30 NOTE — Discharge Instructions (Signed)

## 2020-09-16 ENCOUNTER — Other Ambulatory Visit: Payer: Self-pay

## 2020-09-16 ENCOUNTER — Ambulatory Visit
Admission: RE | Admit: 2020-09-16 | Discharge: 2020-09-16 | Disposition: A | Discharge status: Home / Self Care | Payer: PRIVATE HEALTH INSURANCE | Source: Ambulatory Visit | Attending: Sports Medicine | Admitting: Sports Medicine

## 2020-09-16 DIAGNOSIS — M5412 Radiculopathy, cervical region: Secondary | ICD-10-CM

## 2020-09-16 IMAGING — XA DG INJECT/[PERSON_NAME] INC NEEDLE/CATH/PLC EPI/CERV/THOR W/IMG
2 series · 2 of 2 positions shown · non-contrast
Comparison: none

CLINICAL DATA: Bilateral neck pain and upper extremity
radiculopathy. Multilevel cervical disc disease. Right C7
radiculopathy.

[Series 1: ortho standard · 1 of 1 slices shown (1 of 2)]
[im 1/1]
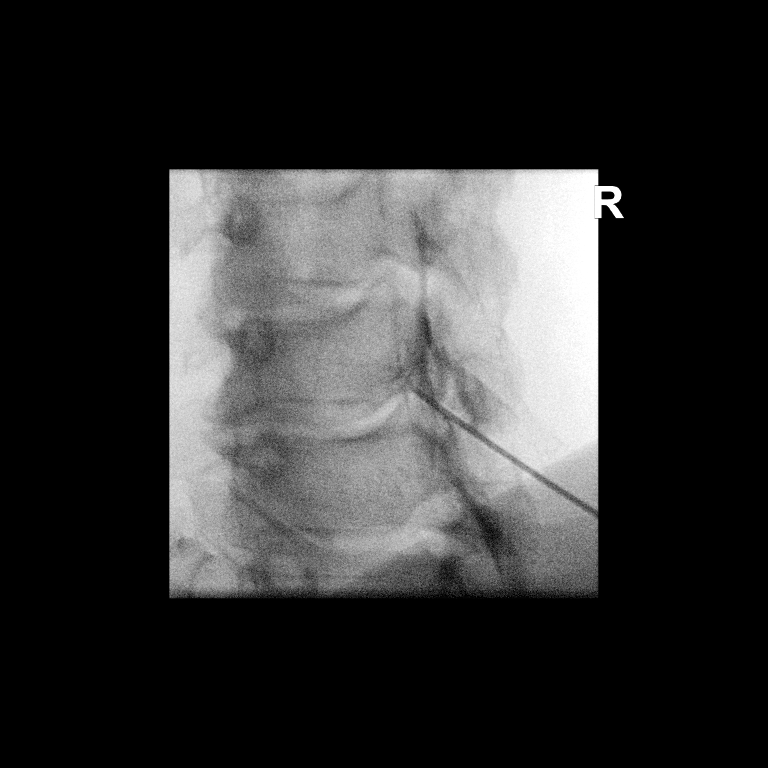

[Series 2: ortho standard · 1 of 1 slices shown (2 of 2)]
[im 1/1]
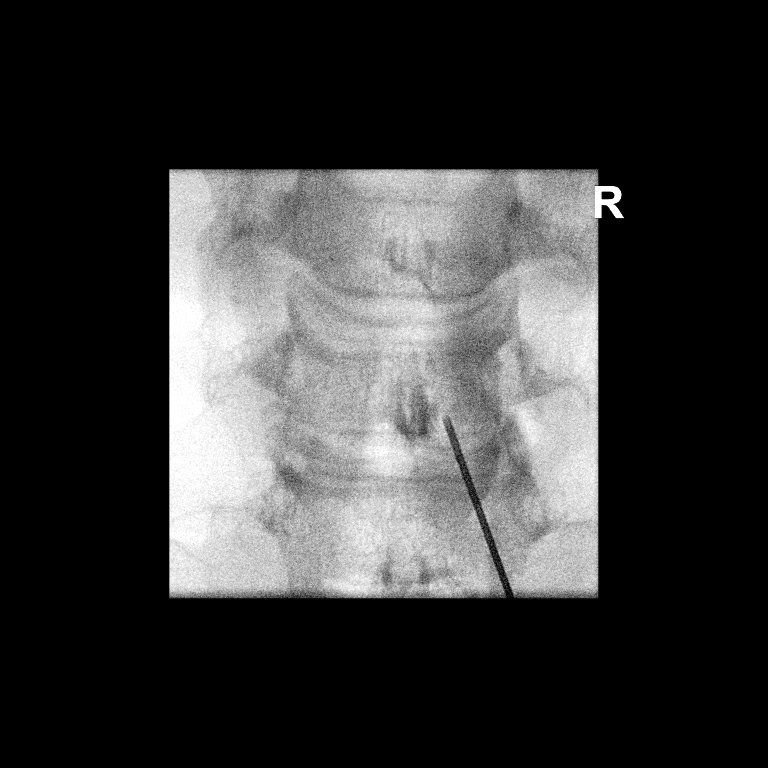

[2 of 2 positions shown; findings below may reference images not displayed]

FLUOROSCOPY TIME:  Radiation Exposure Index (as provided by the
fluoroscopic device): 2.62 uGy*m2

PROCEDURE:
CERVICAL EPIDURAL INJECTION

An interlaminar approach was performed on the right at C6-7. A 20
gauge epidural needle was advanced using loss-of-resistance
technique.

DIAGNOSTIC EPIDURAL INJECTION

Injection of Isovue-M 300 shows a good epidural pattern with spread
above and below the level of needle placement, primarily on the
right. No vascular opacification is seen. THERAPEUTIC

EPIDURAL INJECTION

1.5 ml of Kenalog 40 mixed with 1 ml of 1% Lidocaine and 2 ml of
normal saline were then instilled. The procedure was well-tolerated,
and the patient was discharged thirty minutes following the
injection in good condition.
IMPRESSION: Technically successful first epidural injection on the right at
C6-7.

## 2020-09-16 MED ORDER — TRIAMCINOLONE ACETONIDE 40 MG/ML IJ SUSP (RADIOLOGY)
60.0000 mg | Freq: Once | INTRAMUSCULAR | Status: AC
  Administered 2020-09-16: 60 mg via EPIDURAL

## 2020-09-16 MED ORDER — IOPAMIDOL (ISOVUE-M 300) INJECTION 61%
1.0000 mL | Freq: Once | INTRAMUSCULAR | Status: AC | PRN
  Administered 2020-09-16: 1 mL via EPIDURAL

## 2020-09-16 NOTE — Discharge Instructions (Signed)

## 2020-09-25 ENCOUNTER — Telehealth: Payer: Self-pay

## 2020-09-25 NOTE — Telephone Encounter (Signed)
She can come in sooner, will come up with another plan.

## 2020-09-25 NOTE — Telephone Encounter (Signed)
Patient left VM that she had had all the Neck and back injections and she still had no relief. She is supposed to schedule a 4 week appt with you but wants to come in sooner. Please advise.

## 2020-09-26 NOTE — Telephone Encounter (Signed)
Message left on VM with recommendation. Office number left as well.

## 2020-10-02 ENCOUNTER — Ambulatory Visit (INDEPENDENT_AMBULATORY_CARE_PROVIDER_SITE_OTHER): Payer: PRIVATE HEALTH INSURANCE | Admitting: Sports Medicine

## 2020-10-02 ENCOUNTER — Other Ambulatory Visit: Payer: Self-pay

## 2020-10-02 DIAGNOSIS — M5416 Radiculopathy, lumbar region: Secondary | ICD-10-CM | POA: Diagnosis not present

## 2020-10-02 DIAGNOSIS — M5412 Radiculopathy, cervical region: Secondary | ICD-10-CM

## 2020-10-02 DIAGNOSIS — R0789 Other chest pain: Secondary | ICD-10-CM | POA: Diagnosis not present

## 2020-10-02 NOTE — Assessment & Plan Note (Signed)
Dawn Willis returns, she is a pleasant 45 year old female with a long history of right-sided neck and arm pain, she is had nerve conductions, EMG, looks like a posterior, right-sided decompression at C6-C7. She does have residual C6-C7 disc affecting the thecal sac as well as the right C7 nerve root on MRI. We proceeded with a right C6-C7 interlaminar epidural, unfortunately she has had no relief, so we will go ahead and get a consultation from Dr. Yevette Edwards.

## 2020-10-02 NOTE — Assessment & Plan Note (Signed)
Dawn Willis also has right-sided lumbar radicular pain, the MRI shows a fairly large L5-S1 disc protrusion causing biforaminal stenosis at the L5-S1 level, and looks to be compressing the L5 nerve roots bilaterally, we did proceed with a right L5-S1 transforaminal epidural, also she did not get relief so I would like Dr. Yevette Edwards to weigh in.

## 2020-10-02 NOTE — Assessment & Plan Note (Signed)
Occasional midline and right-sided chest pain. It looks like she did go to the ED back in 2010 or so and had her enzymes cycled, they were negative, it does not sound like she saw a cardiologist or had a stress test afterwards. She does desire to establish care here, so she will probably see Dr. Ashley Royalty for one of our nurse practitioners, I think it would be prudent to risk stratify her with labs and a stress test.
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
## 2020-10-03 ENCOUNTER — Telehealth: Payer: Self-pay

## 2020-10-03 NOTE — Telephone Encounter (Signed)
If she really wants to she can however the other 3 providers here still taking patients are better at primary care than me.

## 2020-10-03 NOTE — Telephone Encounter (Signed)
Pt called and stated she was seen yesterday and wanted to est as a primary care pt. Pt states you are the pcp for her husband Leonardo Plaia (09/03/83) and that she believes the confusion is due to her last name not being changed by the [facility] office yet. Pt would like to know if she is able to see you as her pcp or if she should est with another practitioner.

## 2020-10-21 ENCOUNTER — Ambulatory Visit: Payer: PRIVATE HEALTH INSURANCE | Admitting: Sports Medicine

## 2020-11-06 ENCOUNTER — Other Ambulatory Visit: Payer: Self-pay | Admitting: Orthopedic Surgery

## 2020-11-22 NOTE — Progress Notes (Signed)
Surgical Instructions    Your procedure is scheduled on Wednesday, November 27, 2020.  Report to [facility] Main Entrance "A" at 05:30 A.M., then check in with the Admitting office.  Call this number if you have problems the morning of surgery:  [phone]   If you have any questions prior to your surgery date call [phone]: Open Monday-Friday 8am-4pm    Remember:  Do not eat after midnight the night before your surgery  You may drink clear liquids until 04:30 the morning of your surgery.   Clear liquids allowed are: Water, Non-Citrus Juices (without pulp), Carbonated Beverages, Clear Tea, Black Coffee Only, and Gatorade  Patient Instructions  The night before surgery:  No food after midnight. ONLY clear liquids after midnight  The day of surgery (if you do NOT have diabetes):  Drink ONE (1) Pre-Surgery Clear Ensure by 04:30 the morning of surgery. Drink in one sitting. Do not sip.  This drink was given to you during your hospital  pre-op appointment visit.  Nothing else to drink after completing the  Pre-Surgery Clear Ensure.          If you have questions, please contact your surgeon's office.     Take these medicines the morning of surgery with A SIP OF WATER   omeprazole (PRILOSEC) - if needed  As of today, STOP taking any Aspirin (unless otherwise instructed by your surgeon) Aleve, Naproxen, Ibuprofen, Motrin, Advil, Goody's, BC's, all herbal medications, fish oil, and all vitamins.          Do not wear jewelry or makeup Do not wear lotions, powders, perfumes, or deodorant. Do not shave 48 hours prior to surgery.   Do not bring valuables to the hospital. DO Not wear nail polish, gel polish, artificial nails, or any other type of covering on natural nails including finger and toenails. If patients have artificial nails, gel coating, etc. that need to be removed by a nail salon please have this removed prior to surgery or surgery may need to be canceled/delayed if the  surgeon/ anesthesia feels like the patient is unable to be adequately monitored.             [place] is not responsible for any belongings or valuables.  Do NOT Smoke (Tobacco/Vaping) or drink Alcohol 24 hours prior to your procedure If you use a CPAP at night, you may bring all equipment for your overnight stay.   Contacts, glasses, dentures or bridgework may not be worn into surgery, please bring cases for these belongings   For patients admitted to the hospital, discharge time will be determined by your treatment team.   Patients discharged the day of surgery will not be allowed to drive home, and someone needs to stay with them for 24 hours.  ONLY 1 SUPPORT PERSON MAY BE PRESENT WHILE YOU ARE IN SURGERY. IF YOU ARE TO BE ADMITTED ONCE YOU ARE IN YOUR ROOM YOU WILL BE ALLOWED TWO (2) VISITORS.  Minor children may have two parents present. Special consideration for safety and communication needs will be reviewed on a case by case basis.  Special instructions:    Oral Hygiene is also important to reduce your risk of infection.  Remember - BRUSH YOUR TEETH THE MORNING OF SURGERY WITH YOUR REGULAR TOOTHPASTE   [facility]- Preparing For Surgery  Before surgery, you can play an important role. Because skin is not sterile, your skin needs to be as free of germs as possible. You can reduce the  number of germs on your skin by washing with CHG (chlorahexidine gluconate) Soap before surgery.  CHG is an antiseptic cleaner which kills germs and bonds with the skin to continue killing germs even after washing.     Please do not use if you have an allergy to CHG or antibacterial soaps. If your skin becomes reddened/irritated stop using the CHG.  Do not shave (including legs and underarms) for at least 48 hours prior to first CHG shower. It is OK to shave your face.  Please follow these instructions carefully.     Shower the NIGHT BEFORE SURGERY and the MORNING OF SURGERY with CHG Soap.   If  you chose to wash your hair, wash your hair first as usual with your normal shampoo. After you shampoo, rinse your hair and body thoroughly to remove the shampoo.  Then [employer] and genitals (private parts) with your normal soap and rinse thoroughly to remove soap.  After that Use CHG Soap as you would any other liquid soap. You can apply CHG directly to the skin and wash gently with a scrungie or a clean washcloth.   Apply the CHG Soap to your body ONLY FROM THE NECK DOWN.  Do not use on open wounds or open sores. Avoid contact with your eyes, ears, mouth and genitals (private parts). Wash Face and genitals (private parts)  with your normal soap.   Wash thoroughly, paying special attention to the area where your surgery will be performed.  Thoroughly rinse your body with warm water from the neck down.  DO NOT shower/wash with your normal soap after using and rinsing off the CHG Soap.  Pat yourself dry with a CLEAN TOWEL.  Wear CLEAN PAJAMAS to bed the night before surgery  Place CLEAN SHEETS on your bed the night before your surgery  DO NOT SLEEP WITH PETS.   Day of Surgery:  Take a shower with CHG soap. Wear Clean/Comfortable clothing the morning of surgery Do not apply any deodorants/lotions.   Remember to brush your teeth WITH YOUR REGULAR TOOTHPASTE.   Please read over the following fact sheets that you were given.

## 2020-11-25 ENCOUNTER — Encounter (HOSPITAL_COMMUNITY)
Admission: RE | Admit: 2020-11-25 | Discharge: 2020-11-25 | Disposition: A | Discharge status: Home / Self Care | Payer: PRIVATE HEALTH INSURANCE | Source: Ambulatory Visit | Attending: Orthopedic Surgery | Admitting: Orthopedic Surgery

## 2020-11-25 ENCOUNTER — Other Ambulatory Visit: Payer: Self-pay

## 2020-11-25 ENCOUNTER — Encounter (HOSPITAL_COMMUNITY): Payer: Self-pay

## 2020-11-25 DIAGNOSIS — K219 Gastro-esophageal reflux disease without esophagitis: Secondary | ICD-10-CM | POA: Diagnosis not present

## 2020-11-25 DIAGNOSIS — K589 Irritable bowel syndrome without diarrhea: Secondary | ICD-10-CM | POA: Insufficient documentation

## 2020-11-25 DIAGNOSIS — Z01818 Encounter for other preprocedural examination: Secondary | ICD-10-CM | POA: Insufficient documentation

## 2020-11-25 DIAGNOSIS — Z791 Long term (current) use of non-steroidal anti-inflammatories (NSAID): Secondary | ICD-10-CM | POA: Insufficient documentation

## 2020-11-25 DIAGNOSIS — Z20822 Contact with and (suspected) exposure to covid-19: Secondary | ICD-10-CM | POA: Insufficient documentation

## 2020-11-25 DIAGNOSIS — M5412 Radiculopathy, cervical region: Secondary | ICD-10-CM | POA: Diagnosis not present

## 2020-11-25 DIAGNOSIS — M1990 Unspecified osteoarthritis, unspecified site: Secondary | ICD-10-CM | POA: Insufficient documentation

## 2020-11-25 DIAGNOSIS — Z79899 Other long term (current) drug therapy: Secondary | ICD-10-CM | POA: Diagnosis not present

## 2020-11-25 HISTORY — DX: Anxiety disorder, unspecified: F41.9

## 2020-11-25 HISTORY — DX: Unspecified osteoarthritis, unspecified site: M19.90

## 2020-11-25 HISTORY — DX: Other specified postprocedural states: R11.2

## 2020-11-25 HISTORY — DX: Other specified postprocedural states: Z98.890

## 2020-11-25 LAB — COMPREHENSIVE METABOLIC PANEL
ALT: 13 U/L (ref 0–44)
AST: 12 U/L — ABNORMAL LOW (ref 15–41)
Albumin: 4.1 g/dL (ref 3.5–5.0)
Alkaline Phosphatase: 63 U/L (ref 38–126)
Anion gap: 9 (ref 5–15)
BUN: 7 mg/dL (ref 6–20)
CO2: 21 mmol/L — ABNORMAL LOW (ref 22–32)
Calcium: 9 mg/dL (ref 8.9–10.3)
Chloride: 106 mmol/L (ref 98–111)
Creatinine, Ser: 0.76 mg/dL (ref 0.44–1.00)
GFR, Estimated: >60 mL/min (ref >60)
Glucose, Bld: 84 mg/dL (ref 70–99)
Potassium: 3.3 mmol/L — ABNORMAL LOW (ref 3.5–5.1)
Sodium: 136 mmol/L (ref 135–145)
Total Bilirubin: 0.5 mg/dL (ref 0.3–1.2)
Total Protein: 7 g/dL (ref 6.5–8.1)

## 2020-11-25 LAB — URINALYSIS, ROUTINE W REFLEX MICROSCOPIC
Bilirubin Urine: NEGATIVE
Glucose, UA: NEGATIVE mg/dL
Hgb urine dipstick: NEGATIVE
Ketones, ur: NEGATIVE mg/dL
Leukocytes,Ua: NEGATIVE
Nitrite: NEGATIVE
Protein, ur: NEGATIVE mg/dL
Specific Gravity, Urine: 1.011 (ref 1.005–1.030)
pH: 7 (ref 5.0–8.0)

## 2020-11-25 LAB — CBC WITH DIFFERENTIAL/PLATELET
Abs Immature Granulocytes: 0.02 10*3/uL (ref 0.00–0.07)
Basophils Absolute: 0 10*3/uL (ref 0.0–0.1)
Basophils Relative: 1 %
Eosinophils Absolute: 0.2 10*3/uL (ref 0.0–0.5)
Eosinophils Relative: 3 %
HCT: 42.4 % (ref 36.0–46.0)
Hemoglobin: 13.7 g/dL (ref 12.0–15.0)
Immature Granulocytes: 0 %
Lymphocytes Relative: 43 %
Lymphs Abs: 2.9 10*3/uL (ref 0.7–4.0)
MCH: 31.1 pg (ref 26.0–34.0)
MCHC: 32.3 g/dL (ref 30.0–36.0)
MCV: 96.4 fL (ref 80.0–100.0)
Monocytes Absolute: 0.4 10*3/uL (ref 0.1–1.0)
Monocytes Relative: 6 %
Neutro Abs: 3.2 10*3/uL (ref 1.7–7.7)
Neutrophils Relative %: 47 %
Platelets: 289 10*3/uL (ref 150–400)
RBC: 4.4 MIL/uL (ref 3.87–5.11)
RDW: 12.6 % (ref 11.5–15.5)
WBC: 6.7 10*3/uL (ref 4.0–10.5)
nRBC: 0 % (ref 0.0–0.2)

## 2020-11-25 LAB — TYPE AND SCREEN
ABO/RH(D): A POS
Antibody Screen: NEGATIVE

## 2020-11-25 LAB — APTT: aPTT: 32 seconds (ref 24–36)

## 2020-11-25 LAB — PROTIME-INR
INR: 1 (ref 0.8–1.2)
Prothrombin Time: 13.6 seconds (ref 11.4–15.2)

## 2020-11-25 LAB — SURGICAL PCR SCREEN
MRSA, PCR: NEGATIVE
Staphylococcus aureus: NEGATIVE

## 2020-11-25 NOTE — Progress Notes (Addendum)
PCP - Shireen Quan, MD Cardiologist - denies  PPM/ICD - denies Device Orders - N/A Rep Notified - N/A  Chest x-ray - N/A EKG - 11/25/2020 Stress Test - denies ECHO - denies Cardiac Cath - denies  Sleep Study - denies CPAP - N/A  Fasting Blood Sugar - N/A  Blood Thinner Instructions: N/A Aspirin Instructions: Patient was instructed: As of today, STOP taking any Aspirin (unless otherwise instructed by your surgeon) Aleve, Naproxen, Ibuprofen, Motrin, Advil, Goody's, BC's, all herbal medications, fish oil, and all vitamins.  ERAS Protcol - yes PRE-SURGERY Ensure - yes  COVID TEST- 11/25/2020   Anesthesia review: yes; nausea after anesthesia; abnormal EKG in PAT  Patient denies shortness of breath, fever, cough and chest pain at PAT appointment   All instructions explained to the patient, with a verbal understanding of the material. Patient agrees to go over the instructions while at home for a better understanding. Patient also instructed to self quarantine after being tested for COVID-19. The opportunity to ask questions was provided.

## 2020-11-26 LAB — SARS CORONAVIRUS 2 (TAT 6-24 HRS): SARS Coronavirus 2: NEGATIVE

## 2020-11-26 NOTE — Anesthesia Preprocedure Evaluation (Addendum)
Anesthesia Evaluation  Patient identified by MRN, date of birth, ID band Patient awake    Reviewed: Allergy & Precautions, H&P , NPO status , Patient's Chart, lab work & pertinent test results  History of Anesthesia Complications (+) PONV and history of anesthetic complications  Airway Mallampati: I  TM Distance: >3 FB Neck ROM: Full    Dental no notable dental hx. (+) Teeth Intact, Dental Advisory Given   Pulmonary neg pulmonary ROS, Current Smoker and Patient abstained from smoking.,    Pulmonary exam normal breath sounds clear to auscultation       Cardiovascular Exercise Tolerance: Good negative cardio ROS Normal cardiovascular exam Rhythm:Regular Rate:Normal     Neuro/Psych PSYCHIATRIC DISORDERS Anxiety  Neuromuscular disease negative neurological ROS  negative psych ROS   GI/Hepatic negative GI ROS, Neg liver ROS, GERD  Medicated and Controlled,  Endo/Other  negative endocrine ROS  Renal/GU negative Renal ROS  negative genitourinary   Musculoskeletal negative musculoskeletal ROS (+) Arthritis , Osteoarthritis,    Abdominal   Peds negative pediatric ROS (+)  Hematology negative hematology ROS (+)   Anesthesia Other Findings   Reproductive/Obstetrics negative OB ROS                           Anesthesia Physical Anesthesia Plan  ASA: 2  Anesthesia Plan: General   Post-op Pain Management:    Induction: Intravenous  PONV Risk Score and Plan: 3 and Ondansetron, Treatment may vary due to age or medical condition, Scopolamine patch - Pre-op and Midazolam  Airway Management Planned: Oral ETT and Video Laryngoscope Planned  Additional Equipment: None  Intra-op Plan:   Post-operative Plan: Extubation in OR  Informed Consent: I have reviewed the patients History and Physical, chart, labs and discussed the procedure including the risks, benefits and alternatives for the proposed  anesthesia with the patient or authorized representative who has indicated his/her understanding and acceptance.       Plan Discussed with: Anesthesiologist and CRNA  Anesthesia Plan Comments: (See APP note by Joslyn Hy, FNP )     Anesthesia Quick Evaluation
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
## 2020-11-27 ENCOUNTER — Other Ambulatory Visit: Payer: Self-pay

## 2020-11-27 ENCOUNTER — Ambulatory Visit (HOSPITAL_COMMUNITY): Payer: PRIVATE HEALTH INSURANCE | Admitting: Emergency Medicine

## 2020-11-27 ENCOUNTER — Ambulatory Visit (HOSPITAL_COMMUNITY): Payer: PRIVATE HEALTH INSURANCE

## 2020-11-27 ENCOUNTER — Ambulatory Visit (HOSPITAL_COMMUNITY)
Admission: RE | Disposition: A | Discharge status: Home / Self Care | Payer: Self-pay | Source: Home / Self Care | Attending: Orthopedic Surgery

## 2020-11-27 ENCOUNTER — Ambulatory Visit (HOSPITAL_COMMUNITY)
Admission: RE | Admit: 2020-11-27 | Discharge: 2020-11-27 | Disposition: A | Discharge status: Home / Self Care | Payer: PRIVATE HEALTH INSURANCE | Attending: Orthopedic Surgery | Admitting: Orthopedic Surgery

## 2020-11-27 ENCOUNTER — Encounter (HOSPITAL_COMMUNITY): Payer: Self-pay | Admitting: Orthopedic Surgery

## 2020-11-27 ENCOUNTER — Ambulatory Visit (HOSPITAL_COMMUNITY): Payer: PRIVATE HEALTH INSURANCE | Admitting: Anesthesiology

## 2020-11-27 DIAGNOSIS — Z419 Encounter for procedure for purposes other than remedying health state, unspecified: Secondary | ICD-10-CM

## 2020-11-27 DIAGNOSIS — Z79899 Other long term (current) drug therapy: Secondary | ICD-10-CM | POA: Insufficient documentation

## 2020-11-27 DIAGNOSIS — Z882 Allergy status to sulfonamides status: Secondary | ICD-10-CM | POA: Insufficient documentation

## 2020-11-27 DIAGNOSIS — Z885 Allergy status to narcotic agent status: Secondary | ICD-10-CM | POA: Insufficient documentation

## 2020-11-27 DIAGNOSIS — F17210 Nicotine dependence, cigarettes, uncomplicated: Secondary | ICD-10-CM | POA: Insufficient documentation

## 2020-11-27 DIAGNOSIS — M4802 Spinal stenosis, cervical region: Secondary | ICD-10-CM | POA: Diagnosis not present

## 2020-11-27 DIAGNOSIS — M5412 Radiculopathy, cervical region: Secondary | ICD-10-CM | POA: Insufficient documentation

## 2020-11-27 HISTORY — PX: ANTERIOR CERVICAL DECOMP/DISCECTOMY FUSION: SHX1161

## 2020-11-27 LAB — ABO/RH: ABO/RH(D): A POS

## 2020-11-27 IMAGING — RF DG C-ARM 1-60 MIN
1 series · 2 of 2 positions shown · non-contrast
Comparison: Cervical spine MRI [DATE]. Cervical spine
radiographs [DATE].

CLINICAL DATA: Surgery, elective. Additional history provided:
C5-C7 ACDF. Provided fluoroscopy time 19 seconds (2.26 mGy).

EXAM:
DG CERVICAL SPINE - 1 VIEW; DG C-ARM 1-60 MIN

[Series 1: run · 2 of 2 slices shown]
[im 1/2]
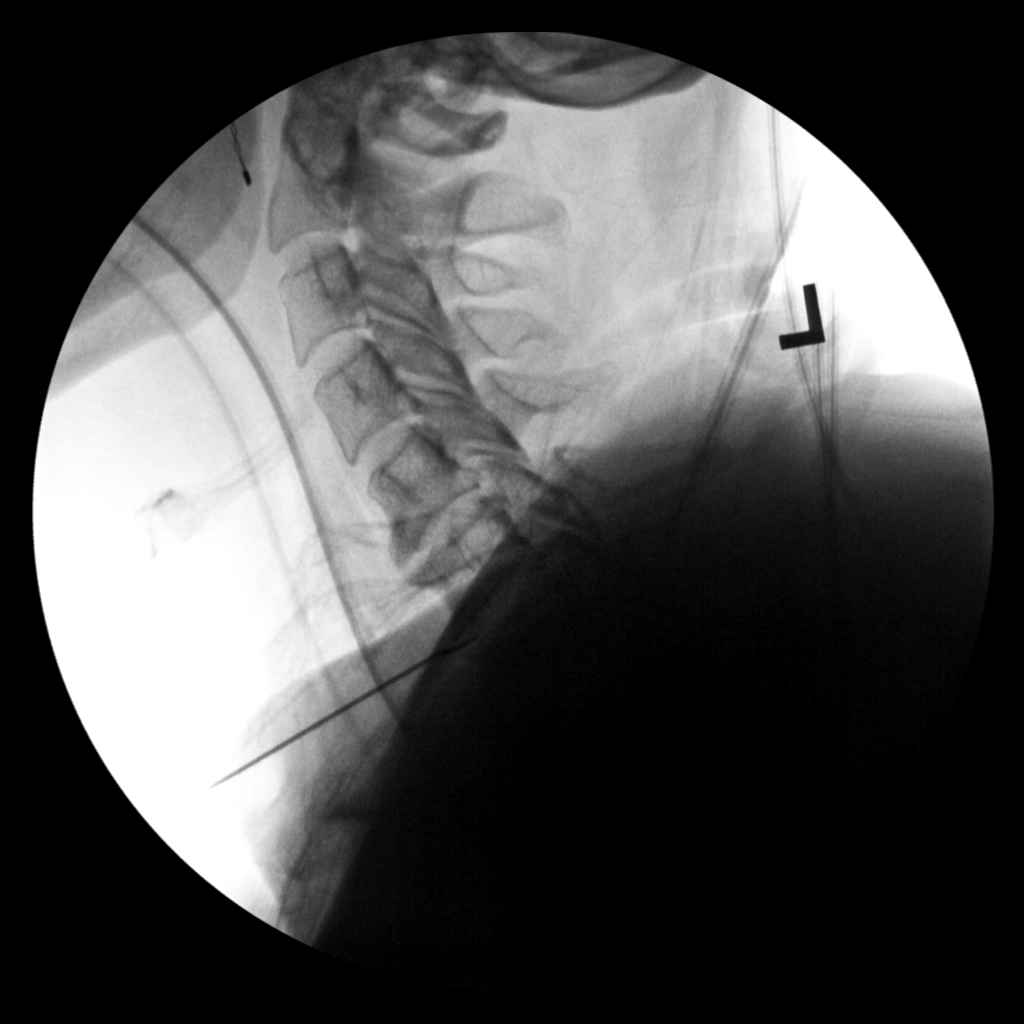
[im 2/2]
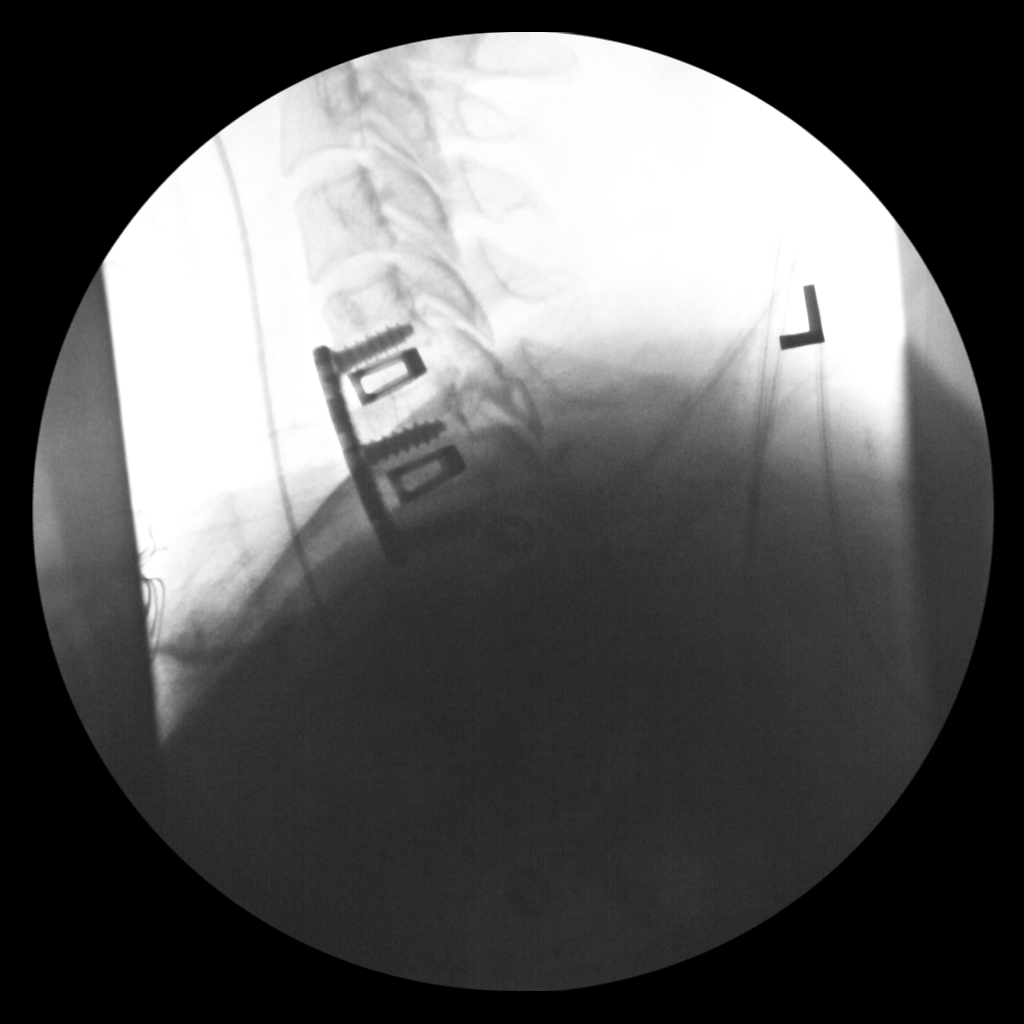

[2 of 2 positions shown; findings below may reference images not displayed]

FINDINGS: Two lateral view intraoperative fluoroscopic images of the cervical
spine are submitted. On the most recent provided image, ACDF
hardware is present at the C5-C7 levels (ventral plate and screws as
well as interbody devices). Partially visualized ET tube. A
temperature probe projects in the region of the oropharynx.
IMPRESSION: Two lateral view intraoperative fluoroscopic images of the cervical
spine from C5-C7 ACDF, as described.

## 2020-11-27 IMAGING — RF DG CERVICAL SPINE 1V
1 series · 2 of 2 positions shown · non-contrast
Comparison: Cervical spine MRI [DATE]. Cervical spine
radiographs [DATE].

CLINICAL DATA: Surgery, elective. Additional history provided:
C5-C7 ACDF. Provided fluoroscopy time 19 seconds (2.26 mGy).

EXAM:
DG CERVICAL SPINE - 1 VIEW; DG C-ARM 1-60 MIN

[Series 1: run · 2 of 2 slices shown]
[im 1/2]
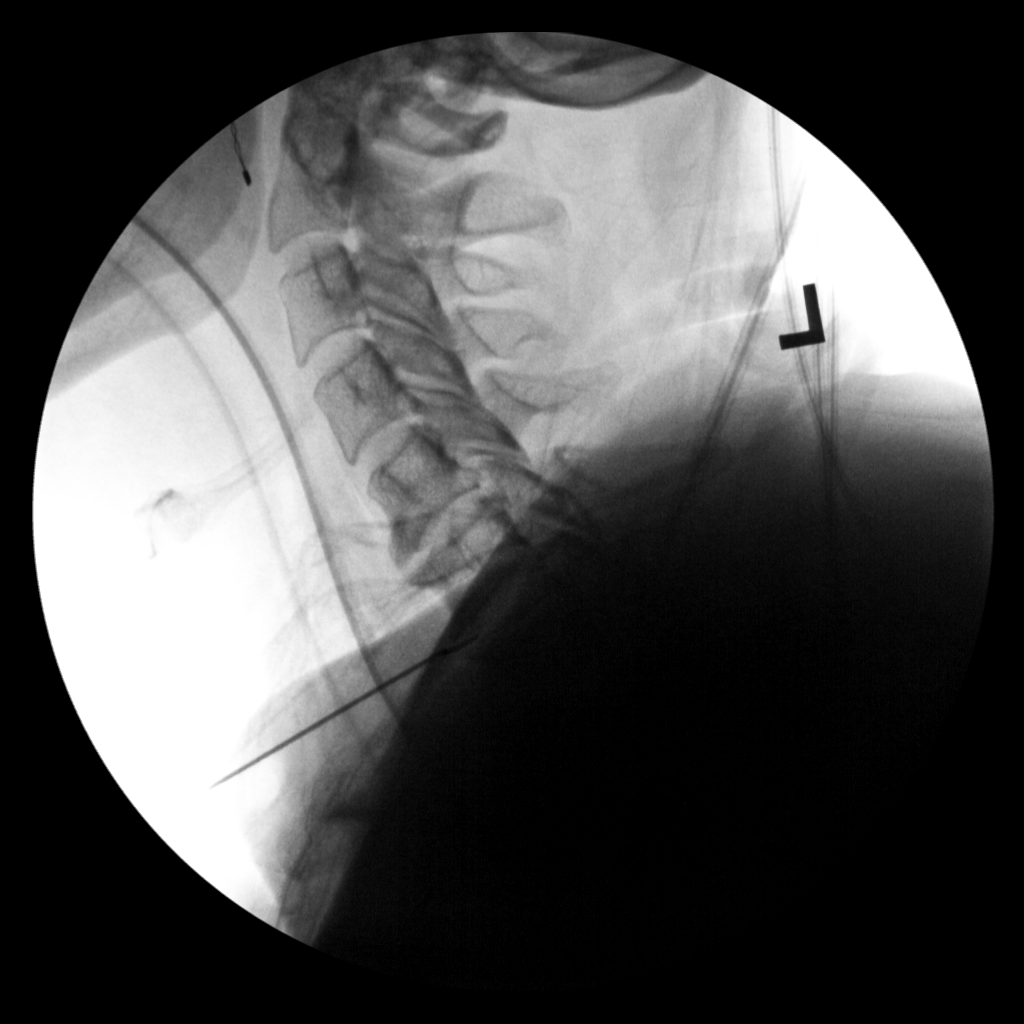
[im 2/2]
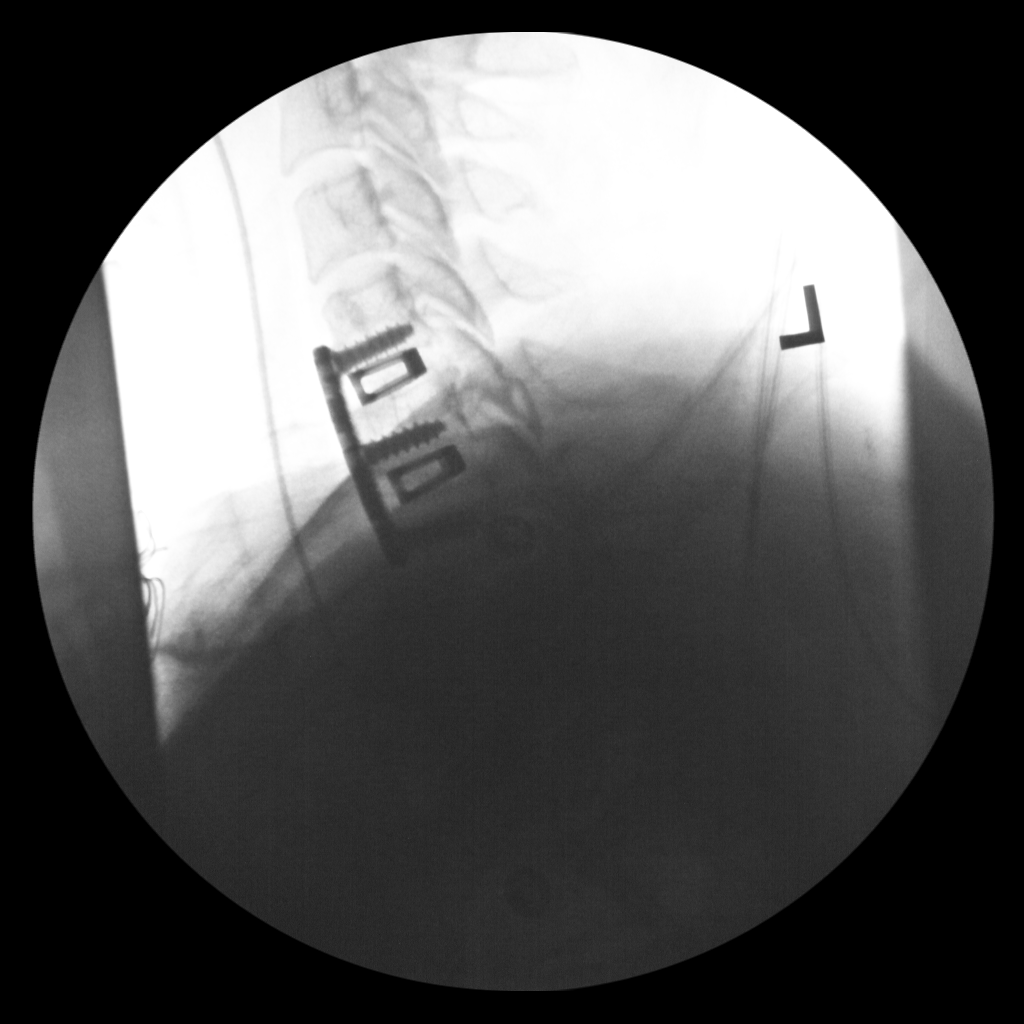

[2 of 2 positions shown; findings below may reference images not displayed]

FINDINGS: Two lateral view intraoperative fluoroscopic images of the cervical
spine are submitted. On the most recent provided image, ACDF
hardware is present at the C5-C7 levels (ventral plate and screws as
well as interbody devices). Partially visualized ET tube. A
temperature probe projects in the region of the oropharynx.
IMPRESSION: Two lateral view intraoperative fluoroscopic images of the cervical
spine from C5-C7 ACDF, as described.

## 2020-11-27 SURGERY — ANTERIOR CERVICAL DECOMPRESSION/DISCECTOMY FUSION 2 LEVELS
Anesthesia: General

## 2020-11-27 MED ORDER — PROPOFOL 10 MG/ML IV BOLUS
INTRAVENOUS | Status: AC
  Filled 2020-11-27: qty 20

## 2020-11-27 MED ORDER — MIDAZOLAM HCL 2 MG/2ML IJ SOLN
INTRAMUSCULAR | Status: DC | PRN
  Administered 2020-11-27: 2 mg via INTRAVENOUS

## 2020-11-27 MED ORDER — ROCURONIUM BROMIDE 10 MG/ML (PF) SYRINGE
PREFILLED_SYRINGE | INTRAVENOUS | Status: AC
  Filled 2020-11-27: qty 10

## 2020-11-27 MED ORDER — BUPIVACAINE-EPINEPHRINE 0.25% -1:200000 IJ SOLN
INTRAMUSCULAR | Status: DC | PRN
  Administered 2020-11-27: 4 mL

## 2020-11-27 MED ORDER — MEPERIDINE HCL 25 MG/ML IJ SOLN: 6.2500 mg | INTRAMUSCULAR | Status: DC | PRN

## 2020-11-27 MED ORDER — SUGAMMADEX SODIUM 200 MG/2ML IV SOLN
INTRAVENOUS | Status: DC | PRN
  Administered 2020-11-27: 200 mg via INTRAVENOUS

## 2020-11-27 MED ORDER — BUPIVACAINE-EPINEPHRINE (PF) 0.25% -1:200000 IJ SOLN
INTRAMUSCULAR | Status: AC
  Filled 2020-11-27: qty 30

## 2020-11-27 MED ORDER — LIDOCAINE 2% (20 MG/ML) 5 ML SYRINGE
INTRAMUSCULAR | Status: DC | PRN
  Administered 2020-11-27: 60 mg via INTRAVENOUS

## 2020-11-27 MED ORDER — ONDANSETRON HCL 4 MG/2ML IJ SOLN
4.0000 mg | Freq: Once | INTRAMUSCULAR | Status: AC | PRN
  Administered 2020-11-27: 4 mg via INTRAVENOUS

## 2020-11-27 MED ORDER — ACETAMINOPHEN 325 MG PO TABS: 325.0000 mg | ORAL_TABLET | ORAL | Status: DC | PRN

## 2020-11-27 MED ORDER — FENTANYL CITRATE (PF) 250 MCG/5ML IJ SOLN
INTRAMUSCULAR | Status: AC
  Filled 2020-11-27: qty 5

## 2020-11-27 MED ORDER — LACTATED RINGERS IV SOLN: INTRAVENOUS | Status: DC | PRN

## 2020-11-27 MED ORDER — ONDANSETRON HCL 4 MG/2ML IJ SOLN
INTRAMUSCULAR | Status: AC
  Filled 2020-11-27: qty 2

## 2020-11-27 MED ORDER — ORAL CARE MOUTH RINSE: 15.0000 mL | Freq: Once | OROMUCOSAL | Status: AC

## 2020-11-27 MED ORDER — POVIDONE-IODINE 7.5 % EX SOLN: Freq: Once | CUTANEOUS | Status: DC

## 2020-11-27 MED ORDER — OXYCODONE HCL 5 MG/5ML PO SOLN: 5.0000 mg | Freq: Once | ORAL | Status: AC | PRN

## 2020-11-27 MED ORDER — DEXAMETHASONE SODIUM PHOSPHATE 10 MG/ML IJ SOLN
INTRAMUSCULAR | Status: AC
  Filled 2020-11-27: qty 1

## 2020-11-27 MED ORDER — OXYCODONE HCL 5 MG PO TABS
ORAL_TABLET | ORAL | Status: AC
  Filled 2020-11-27: qty 1

## 2020-11-27 MED ORDER — ROCURONIUM BROMIDE 10 MG/ML (PF) SYRINGE
PREFILLED_SYRINGE | INTRAVENOUS | Status: DC | PRN
  Administered 2020-11-27 (×3): 10 mg via INTRAVENOUS
  Administered 2020-11-27: 60 mg via INTRAVENOUS

## 2020-11-27 MED ORDER — THROMBIN 20000 UNITS EX KIT
PACK | CUTANEOUS | Status: AC
  Filled 2020-11-27: qty 1

## 2020-11-27 MED ORDER — ONDANSETRON 4 MG PO TBDP: 4.0000 mg | ORAL_TABLET | Freq: Three times a day (TID) | ORAL | 0 refills | Status: DC | PRN

## 2020-11-27 MED ORDER — CHLORHEXIDINE GLUCONATE 0.12 % MT SOLN
15.0000 mL | Freq: Once | OROMUCOSAL | Status: AC
  Administered 2020-11-27: 15 mL via OROMUCOSAL
  Filled 2020-11-27: qty 15

## 2020-11-27 MED ORDER — LABETALOL HCL 5 MG/ML IV SOLN
INTRAVENOUS | Status: AC
  Filled 2020-11-27: qty 4

## 2020-11-27 MED ORDER — THROMBIN 20000 UNITS EX SOLR
CUTANEOUS | Status: DC | PRN
  Administered 2020-11-27: 20000 [IU] via TOPICAL

## 2020-11-27 MED ORDER — 0.9 % SODIUM CHLORIDE (POUR BTL) OPTIME
TOPICAL | Status: DC | PRN
  Administered 2020-11-27: 1000 mL

## 2020-11-27 MED ORDER — PROPOFOL 10 MG/ML IV BOLUS
INTRAVENOUS | Status: DC | PRN
  Administered 2020-11-27: 50 mg via INTRAVENOUS
  Administered 2020-11-27: 150 mg via INTRAVENOUS

## 2020-11-27 MED ORDER — CEFAZOLIN SODIUM-DEXTROSE 2-4 GM/100ML-% IV SOLN
2.0000 g | INTRAVENOUS | Status: AC
  Administered 2020-11-27: 2 g via INTRAVENOUS
  Filled 2020-11-27: qty 100

## 2020-11-27 MED ORDER — SCOPOLAMINE 1 MG/3DAYS TD PT72
MEDICATED_PATCH | TRANSDERMAL | Status: AC
  Filled 2020-11-27: qty 1

## 2020-11-27 MED ORDER — LABETALOL HCL 5 MG/ML IV SOLN
INTRAVENOUS | Status: DC | PRN
  Administered 2020-11-27 (×2): 5 mg via INTRAVENOUS

## 2020-11-27 MED ORDER — METHOCARBAMOL 500 MG PO TABS
500.0000 mg | ORAL_TABLET | Freq: Once | ORAL | Status: AC
  Administered 2020-11-27: 500 mg via ORAL

## 2020-11-27 MED ORDER — FENTANYL CITRATE (PF) 100 MCG/2ML IJ SOLN
25.0000 ug | INTRAMUSCULAR | Status: DC | PRN
  Administered 2020-11-27 (×3): 50 ug via INTRAVENOUS

## 2020-11-27 MED ORDER — ACETAMINOPHEN 160 MG/5ML PO SOLN: 325.0000 mg | ORAL | Status: DC | PRN

## 2020-11-27 MED ORDER — LIDOCAINE 2% (20 MG/ML) 5 ML SYRINGE
INTRAMUSCULAR | Status: AC
  Filled 2020-11-27: qty 5

## 2020-11-27 MED ORDER — FENTANYL CITRATE (PF) 250 MCG/5ML IJ SOLN
INTRAMUSCULAR | Status: DC | PRN
  Administered 2020-11-27: 50 ug via INTRAVENOUS
  Administered 2020-11-27: 100 ug via INTRAVENOUS
  Administered 2020-11-27 (×2): 50 ug via INTRAVENOUS

## 2020-11-27 MED ORDER — FENTANYL CITRATE (PF) 100 MCG/2ML IJ SOLN
INTRAMUSCULAR | Status: AC
  Filled 2020-11-27: qty 2

## 2020-11-27 MED ORDER — ACETAMINOPHEN 10 MG/ML IV SOLN
INTRAVENOUS | Status: DC | PRN
  Administered 2020-11-27: 1000 mg via INTRAVENOUS

## 2020-11-27 MED ORDER — SCOPOLAMINE 1 MG/3DAYS TD PT72
MEDICATED_PATCH | TRANSDERMAL | Status: DC | PRN
  Administered 2020-11-27: 1 via TRANSDERMAL

## 2020-11-27 MED ORDER — METHOCARBAMOL 500 MG PO TABS
ORAL_TABLET | ORAL | Status: AC
  Filled 2020-11-27: qty 1

## 2020-11-27 MED ORDER — OXYCODONE HCL 5 MG PO TABS
5.0000 mg | ORAL_TABLET | Freq: Once | ORAL | Status: AC | PRN
  Administered 2020-11-27: 5 mg via ORAL

## 2020-11-27 MED ORDER — LACTATED RINGERS IV SOLN: INTRAVENOUS | Status: DC

## 2020-11-27 MED ORDER — METHOCARBAMOL 500 MG PO TABS: 500.0000 mg | ORAL_TABLET | Freq: Four times a day (QID) | ORAL | 0 refills | Status: DC | PRN

## 2020-11-27 MED ORDER — ONDANSETRON HCL 4 MG/2ML IJ SOLN
INTRAMUSCULAR | Status: DC | PRN
  Administered 2020-11-27: 4 mg via INTRAVENOUS

## 2020-11-27 MED ORDER — ACETAMINOPHEN 10 MG/ML IV SOLN
INTRAVENOUS | Status: AC
  Filled 2020-11-27: qty 100

## 2020-11-27 MED ORDER — MIDAZOLAM HCL 2 MG/2ML IJ SOLN
INTRAMUSCULAR | Status: AC
  Filled 2020-11-27: qty 2

## 2020-11-27 MED ORDER — OXYCODONE-ACETAMINOPHEN 5-325 MG PO TABS: 1.0000 | ORAL_TABLET | Freq: Four times a day (QID) | ORAL | 0 refills | Status: AC | PRN

## 2020-11-27 SURGICAL SUPPLY — 80 items
APL SKNCLS STERI-STRIP NONHPOA (GAUZE/BANDAGES/DRESSINGS) ×1
BAG COUNTER SPONGE SURGICOUNT (BAG) ×2 IMPLANT
BAG SPNG CNTER NS LX DISP (BAG) ×1
BAG SURGICOUNT SPONGE COUNTING (BAG) ×1
BENZOIN TINCTURE PRP APPL 2/3 (GAUZE/BANDAGES/DRESSINGS) ×3 IMPLANT
BIT DRILL NEURO 2X3.1 SFT TUCH (MISCELLANEOUS) ×1 IMPLANT
BIT DRILL SRG 14X2.2XFLT CHK (BIT) IMPLANT
BIT DRL SRG 14X2.2XFLT CHK (BIT) ×1
BLADE CLIPPER SURG (BLADE) ×3 IMPLANT
BLADE SURG 15 STRL LF DISP TIS (BLADE) ×1 IMPLANT
BLADE SURG 15 STRL SS (BLADE) ×3
BONE VIVIGEN FORMABLE 1.3CC (Bone Implant) ×6 IMPLANT
CARTRIDGE OIL MAESTRO DRILL (MISCELLANEOUS) ×1 IMPLANT
CLOSURE STERI-STRIP 1/2X4 (GAUZE/BANDAGES/DRESSINGS) ×1
CLOSURE WOUND 1/2 X4 (GAUZE/BANDAGES/DRESSINGS) ×1
CLSR STERI-STRIP ANTIMIC 1/2X4 (GAUZE/BANDAGES/DRESSINGS) ×1 IMPLANT
COLLAR CERV LO CONTOUR FIRM DE (SOFTGOODS) ×2 IMPLANT
CORD BIPOLAR FORCEPS 12FT (ELECTRODE) ×3 IMPLANT
COVER SURGICAL LIGHT HANDLE (MISCELLANEOUS) ×3 IMPLANT
DECANTER SPIKE VIAL GLASS SM (MISCELLANEOUS) ×3 IMPLANT
DIFFUSER DRILL AIR PNEUMATIC (MISCELLANEOUS) ×3 IMPLANT
DRAPE C-ARM 42X72 X-RAY (DRAPES) ×3 IMPLANT
DRAPE POUCH INSTRU U-SHP 10X18 (DRAPES) ×3 IMPLANT
DRAPE SURG 17X23 STRL (DRAPES) ×12 IMPLANT
DRILL BIT SKYLINE 14MM (BIT) ×3
DRILL NEURO 2X3.1 SOFT TOUCH (MISCELLANEOUS) ×3
DURAPREP 26ML APPLICATOR (WOUND CARE) ×3 IMPLANT
ELECT COATED BLADE 2.86 ST (ELECTRODE) ×3 IMPLANT
ELECT REM PT RETURN 9FT ADLT (ELECTROSURGICAL) ×3
ELECTRODE REM PT RTRN 9FT ADLT (ELECTROSURGICAL) ×1 IMPLANT
GAUZE 4X4 16PLY ~~LOC~~+RFID DBL (SPONGE) ×3 IMPLANT
GAUZE SPONGE 4X4 12PLY STRL (GAUZE/BANDAGES/DRESSINGS) ×3 IMPLANT
GLOVE SRG 8 PF TXTR STRL LF DI (GLOVE) ×1 IMPLANT
GLOVE SURG ENC MOIS LTX SZ7 (GLOVE) ×3 IMPLANT
GLOVE SURG ENC MOIS LTX SZ8 (GLOVE) ×3 IMPLANT
GLOVE SURG UNDER POLY LF SZ7 (GLOVE) ×6 IMPLANT
GLOVE SURG UNDER POLY LF SZ8 (GLOVE) ×3
GOWN STRL REUS W/ TWL LRG LVL3 (GOWN DISPOSABLE) ×1 IMPLANT
GOWN STRL REUS W/ TWL XL LVL3 (GOWN DISPOSABLE) ×1 IMPLANT
GOWN STRL REUS W/TWL LRG LVL3 (GOWN DISPOSABLE) ×3
GOWN STRL REUS W/TWL XL LVL3 (GOWN DISPOSABLE) ×3
GRAFT BNE MATRIX VG FRMBL SM 1 (Bone Implant) IMPLANT
INTERLOCK LRDTC CRVCL VBR 6MM (Bone Implant) IMPLANT
IV CATH 14GX2 1/4 (CATHETERS) ×3 IMPLANT
KIT BASIN OR (CUSTOM PROCEDURE TRAY) ×3 IMPLANT
KIT TURNOVER KIT B (KITS) ×3 IMPLANT
LORDOTIC CERVICAL VBR 6MM SM (Bone Implant) ×6 IMPLANT
MANIFOLD NEPTUNE II (INSTRUMENTS) ×3 IMPLANT
NDL PRECISIONGLIDE 27X1.5 (NEEDLE) ×1 IMPLANT
NDL SPNL 20GX3.5 QUINCKE YW (NEEDLE) ×1 IMPLANT
NEEDLE PRECISIONGLIDE 27X1.5 (NEEDLE) ×3 IMPLANT
NEEDLE SPNL 20GX3.5 QUINCKE YW (NEEDLE) ×3 IMPLANT
NS IRRIG 1000ML POUR BTL (IV SOLUTION) ×3 IMPLANT
OIL CARTRIDGE MAESTRO DRILL (MISCELLANEOUS) ×3
PACK ORTHO CERVICAL (CUSTOM PROCEDURE TRAY) ×3 IMPLANT
PAD ARMBOARD 7.5X6 YLW CONV (MISCELLANEOUS) ×6 IMPLANT
PATTIES SURGICAL .5 X.5 (GAUZE/BANDAGES/DRESSINGS) ×2 IMPLANT
PATTIES SURGICAL .5 X1 (DISPOSABLE) ×3 IMPLANT
PIN DISTRACTION 12MM (PIN) ×6
PIN DSTRCT 12XNS SS ACIS (PIN) IMPLANT
PLATE TWO LEVEL SKYLINE 30MM (Plate) ×2 IMPLANT
POSITIONER HEAD DONUT 9IN (MISCELLANEOUS) ×3 IMPLANT
SCREW SKYLINE VAR OS 14MM (Screw) ×12 IMPLANT
SPONGE INTESTINAL PEANUT (DISPOSABLE) ×3 IMPLANT
SPONGE SURGIFOAM ABS GEL 100 (HEMOSTASIS) ×3 IMPLANT
SPONGE T-LAP 4X18 ~~LOC~~+RFID (SPONGE) ×4 IMPLANT
STRIP CLOSURE SKIN 1/2X4 (GAUZE/BANDAGES/DRESSINGS) ×2 IMPLANT
SUT MNCRL AB 4-0 PS2 18 (SUTURE) ×3 IMPLANT
SUT SILK 4 0 (SUTURE) ×3
SUT SILK 4-0 18XBRD TIE 12 (SUTURE) IMPLANT
SUT VIC AB 2-0 CT2 18 VCP726D (SUTURE) ×3 IMPLANT
SYR BULB IRRIG 60ML STRL (SYRINGE) ×3 IMPLANT
SYR CONTROL 10ML LL (SYRINGE) ×9 IMPLANT
TAPE CLOTH 4X10 WHT NS (GAUZE/BANDAGES/DRESSINGS) ×3 IMPLANT
TAPE CLOTH SURG 4X10 WHT LF (GAUZE/BANDAGES/DRESSINGS) ×2 IMPLANT
TAPE UMBILICAL COTTON 1/8X30 (MISCELLANEOUS) ×3 IMPLANT
TOWEL GREEN STERILE (TOWEL DISPOSABLE) ×3 IMPLANT
TOWEL GREEN STERILE FF (TOWEL DISPOSABLE) ×3 IMPLANT
WATER STERILE IRR 1000ML POUR (IV SOLUTION) ×3 IMPLANT
YANKAUER SUCT BULB TIP NO VENT (SUCTIONS) ×3 IMPLANT

## 2020-11-27 NOTE — Anesthesia Postprocedure Evaluation (Signed)
Anesthesia Post Note  Patient: Dawn Willis  Procedure(s) Performed: ANTERIOR CERVICAL DECOMPRESSION FUSION CERVICAL 5- CERVICAL 6, CERVICAL 6- CERVICAL 7 WITH INSTRUMENTATION AND ALLOGRAFT     Patient location during evaluation: PACU Anesthesia Type: General Level of consciousness: awake and alert Pain management: pain level controlled Vital Signs Assessment: post-procedure vital signs reviewed and stable Respiratory status: spontaneous breathing, nonlabored ventilation, respiratory function stable and patient connected to nasal cannula oxygen Cardiovascular status: blood pressure returned to baseline and stable Postop Assessment: no apparent nausea or vomiting Anesthetic complications: no   No notable events documented.  Last Vitals:  Vitals:   11/27/20 1130 11/27/20 1145  BP: 121/71 121/60  Pulse: 69 67  Resp: 11 15  Temp:    SpO2: 92% 96%    Last Pain:  Vitals:   11/27/20 1145  PainSc: 5                  Jacarie Pate
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
## 2020-11-27 NOTE — Transfer of Care (Signed)
Immediate Anesthesia Transfer of Care Note  Patient: Dawn Willis  Procedure(s) Performed: ANTERIOR CERVICAL DECOMPRESSION FUSION CERVICAL 5- CERVICAL 6, CERVICAL 6- CERVICAL 7 WITH INSTRUMENTATION AND ALLOGRAFT  Patient Location: PACU  Anesthesia Type:General  Level of Consciousness: awake, alert , patient cooperative and responds to stimulation  Airway & Oxygen Therapy: Patient Spontanous Breathing and Patient connected to face mask oxygen  Post-op Assessment: Report given to RN, Post -op Vital signs reviewed and stable and Patient moving all extremities X 4  Post vital signs: Reviewed and stable  Last Vitals:  Vitals Value Taken Time  BP 132/86 11/27/20 1101  Temp    Pulse 73 11/27/20 1102  Resp 14 11/27/20 1102  SpO2 100 % 11/27/20 1102  Vitals shown include unvalidated device data.  Last Pain:  Vitals:   11/27/20 0601  PainSc: 3       Patients Stated Pain Goal: 3 (11/27/20 0601)  Complications: No notable events documented.

## 2020-11-27 NOTE — Op Note (Signed)
PATIENT NAME: Dawn Willis   MEDICAL RECORD NO.:   [id number]    DATE OF BIRTH: June 18, 1975   DATE OF PROCEDURE: 11/27/2020                               OPERATIVE REPORT     PREOPERATIVE DIAGNOSES: 1. Right-sided cervical radiculopathy. 2. Spinal stenosis spanning C5-C7.   POSTOPERATIVE DIAGNOSES: 1. Right-sided cervical radiculopathy. 2. Spinal stenosis spanning C5-C7.   PROCEDURE: 1. Anterior cervical decompression and fusion C5/6, C6/7. 2. Placement of anterior instrumentation, C5-C7. 3. Insertion of interbody device x 2 (Titan intervertebral spacers). 4. Intraoperative use of fluoroscopy. 5. Use of morselized allograft - ViviGen.   SURGEON:  Estill Bamberg, MD   ASSISTANT:  Jason Coop, PA-C.   ANESTHESIA:  General endotracheal anesthesia.   COMPLICATIONS:  None.   DISPOSITION:  Stable.   ESTIMATED BLOOD LOSS:  Minimal.   INDICATIONS FOR SURGERY:  Briefly, Ms. Dawn Willis is a pleasant 45 y.o. year- old patient, who did present to me with severe pain in the neck and right arm.  The patient's MRI did reveal the findings noted above.  Given the patient's ongoing rather debilitating pain and lack of improvement with appropriate treatment measures, we did discuss proceeding with the procedure noted above.  The patient was fully aware of the risks and limitations of surgery as outlined in my preoperative note.   OPERATIVE DETAILS:  On 11/27/2020, the patient was brought to surgery and general endotracheal anesthesia was administered.  The patient was placed supine on the hospital bed. The neck was gently extended.  All bony prominences were meticulously padded.  The neck was prepped and draped in the usual sterile fashion.  At this point, I did make a left-sided transverse incision.  The platysma was incised.  A Smith-Robinson approach was used and the anterior spine was identified. A self-retaining retractor was placed.  I then subperiosteally exposed the vertebral  bodies from C5-C7.  Caspar pins were then placed into the C6 and C7 vertebral bodies and distraction was applied.  A thorough and complete C6-7 intervertebral diskectomy was performed.  The posterior longitudinal ligament was identified and entered using a nerve hook.  I then used #1 followed by #2 Kerrison to perform a thorough and complete intervertebral diskectomy.  The spinal canal was thoroughly decompressed, as was the right neuroforamen.  The endplates were then prepared and the appropriate-sized intervertebral spacer was then packed with ViviGen and tamped into position in the usual fashion.  The lower Caspar pin was then removed and placed into the C5 vertebral body and once again, distraction was applied across the C5-6 intervertebral space.  I then again performed a thorough and complete diskectomy, thoroughly decompressing the spinal canal and bilateral neuroforamena.  After preparing the endplates, the appropriate-sized intervertebral spacer was packed with ViviGen and tamped into position. The Caspar pins then were removed and bone wax was placed in their place.  The appropriate-sized anterior cervical plate was placed over the anterior spine.  14 mm variable angle screws were placed, 2 in each vertebral body from C5-C7 for a total of 6 vertebral body screws.  The screws were then locked to the plate using the Cam locking mechanism.  I was very pleased with the final fluoroscopic images.  The wound was then irrigated.  The wound was then explored for any undue bleeding and there was no bleeding noted. The wound was then closed in  layers using 2-0 Vicryl, followed by 4-0 Monocryl.  Benzoin and Steri-Strips were applied, followed by sterile dressing.  All instrument counts were correct at the termination of the procedure.   Of note, Jason Coop, PA-C, was my assistant throughout surgery, and did aid in retraction, suctioning, placement of the hardware, and closure from  start to finish.       Estill Bamberg, MD

## 2020-11-27 NOTE — Anesthesia Procedure Notes (Signed)
Procedure Name: Intubation Date/Time: 11/27/2020 8:10 AM Performed by: Michele Rockers, CRNA Pre-anesthesia Checklist: Patient identified, Patient being monitored, Timeout performed, Emergency Drugs available and Suction available Patient Re-evaluated:Patient Re-evaluated prior to induction Oxygen Delivery Method: Circle system utilized Preoxygenation: Pre-oxygenation with 100% oxygen Induction Type: IV induction Ventilation: Mask ventilation without difficulty Laryngoscope Size: Mac and 3 Grade View: Grade I Tube type: Oral Tube size: 7.5 mm Number of attempts: 1 Airway Equipment and Method: Stylet Placement Confirmation: ETT inserted through vocal cords under direct vision, positive ETCO2 and breath sounds checked- equal and bilateral Secured at: 21 cm Tube secured with: Tape Dental Injury: Teeth and Oropharynx as per pre-operative assessment

## 2020-11-28 ENCOUNTER — Encounter (HOSPITAL_COMMUNITY): Payer: Self-pay | Admitting: Orthopedic Surgery

## 2020-11-28 MED FILL — Thrombin For Soln Kit 20000 Unit: CUTANEOUS | Qty: 1 | Status: AC

## 2020-12-20 ENCOUNTER — Other Ambulatory Visit: Payer: Self-pay

## 2020-12-20 MED ORDER — AMITRIPTYLINE HCL 10 MG PO TABS: 20.0000 mg | ORAL_TABLET | Freq: Every day | ORAL | 3 refills | Status: DC

## 2020-12-20 MED ORDER — DULOXETINE HCL 60 MG PO CPEP: 60.0000 mg | ORAL_CAPSULE | Freq: Every day | ORAL | 3 refills | Status: DC

## 2020-12-20 NOTE — Telephone Encounter (Signed)
Last OV: 10/02/20 Last Fill: never filled by [place]  Patient reports that she recently has surgery and also tested positive for COVID. She has a dose for tonight but will not have any for tomorrow.

## 2020-12-20 NOTE — Telephone Encounter (Signed)
Yes, definitely needs to be filled by her PCP.

## 2020-12-23 ENCOUNTER — Telehealth: Payer: Self-pay

## 2020-12-23 NOTE — Telephone Encounter (Signed)
Dawn Willis called and left a message stating she has a problem with her medication. I called and left a message for a return call.

## 2021-02-12 ENCOUNTER — Other Ambulatory Visit: Payer: Self-pay | Admitting: Sports Medicine

## 2021-06-09 ENCOUNTER — Other Ambulatory Visit: Payer: Self-pay | Admitting: Sports Medicine

## 2021-07-09 ENCOUNTER — Other Ambulatory Visit: Payer: Self-pay | Admitting: Sports Medicine

## 2021-07-18 ENCOUNTER — Ambulatory Visit (INDEPENDENT_AMBULATORY_CARE_PROVIDER_SITE_OTHER): Payer: 59

## 2021-07-18 ENCOUNTER — Ambulatory Visit (INDEPENDENT_AMBULATORY_CARE_PROVIDER_SITE_OTHER): Payer: 59 | Admitting: Sports Medicine

## 2021-07-18 ENCOUNTER — Other Ambulatory Visit: Payer: Self-pay

## 2021-07-18 DIAGNOSIS — R2231 Localized swelling, mass and lump, right upper limb: Secondary | ICD-10-CM | POA: Diagnosis not present

## 2021-07-18 DIAGNOSIS — G5601 Carpal tunnel syndrome, right upper limb: Secondary | ICD-10-CM

## 2021-07-18 DIAGNOSIS — M5412 Radiculopathy, cervical region: Secondary | ICD-10-CM | POA: Diagnosis not present

## 2021-07-18 DIAGNOSIS — G4700 Insomnia, unspecified: Secondary | ICD-10-CM | POA: Insufficient documentation

## 2021-07-18 DIAGNOSIS — F5101 Primary insomnia: Secondary | ICD-10-CM

## 2021-07-18 DIAGNOSIS — F39 Unspecified mood [affective] disorder: Secondary | ICD-10-CM | POA: Diagnosis not present

## 2021-07-18 MED ORDER — ZOLPIDEM TARTRATE 5 MG PO TABS: 5.0000 mg | ORAL_TABLET | Freq: Every evening | ORAL | 1 refills | Status: AC | PRN

## 2021-07-18 MED ORDER — AMITRIPTYLINE HCL 10 MG PO TABS: 20.0000 mg | ORAL_TABLET | Freq: Every day | ORAL | 3 refills | Status: AC

## 2021-07-18 MED ORDER — DULOXETINE HCL 20 MG PO CPEP: ORAL_CAPSULE | ORAL | 0 refills | Status: AC

## 2021-07-18 NOTE — Assessment & Plan Note (Signed)
Dawn Willis does have nerve conduction study and EMG confirmed carpal tunnel syndrome on the right, symptoms are predominantly nocturnal and she endorses the entire hand goes numb. She has more symptoms with abduction of the shoulder, she does not however lose her pulse during this maneuver. 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 She had a negative Tinel's and Phalen sign on exam today. In the spirit of treating horses not Zebras we will start with nighttime splinting, she has a splint at home already.  She will also do carpal tunnel conditioning. We will do this for about 6 weeks followed by median nerve hydrodissection if not better. If she fails the median nerve hydrodissection I think we should get another nerve conduction study to further evaluate if she has nerve impingement somewhere along her upper arm or forearm.

## 2021-07-18 NOTE — Progress Notes (Addendum)
° ° °  Procedures performed today:    None.  Independent interpretation of notes and tests performed by another provider:   None.  Brief History, Exam, Impression, and Recommendations:    Right cervical radiculopathy Dawn Willis returns, she is a pleasant 46 year old female, she was having right arm numbness and tingling with a nerve conduction study that showed both cervical radiculitis and carpal tunnel syndrome. She has recovered well from surgery.  Unfortunately continues to have paresthesias in the right hand.  Carpal tunnel syndrome, right Dawn Willis does have nerve conduction study and EMG confirmed carpal tunnel syndrome on the right, symptoms are predominantly nocturnal and she endorses the entire hand goes numb. She has more symptoms with abduction of the shoulder, she does not however lose her pulse during this maneuver. 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 She had a negative Tinel's and Phalen sign on exam today. In the spirit of treating horses not Zebras we will start with nighttime splinting, she has a splint at home already.  She will also do carpal tunnel conditioning. We will do this for about 6 weeks followed by median nerve hydrodissection if not better. If she fails the median nerve hydrodissection I think we should get another nerve conduction study to further evaluate if she has nerve impingement somewhere along her upper arm or forearm.  Insomnia Emotionally healthy right now though she is somewhat stressed by her husband and his brothers alcohol consumption and disease processes. She does desire to come off of her Cymbalta, we will do a down taper, she is aware of the potential worsening of mood coming off of Cymbalta. She is having significant insomnia, sleep hygiene is okay, I will send in a short course of Ambien and she can follow this up with her new PCP. Continue amitriptyline for now.  Mass of skin of shoulder, right Potential  soft tissue mass subcutaneous tissues over the deltoid, feels to be a cyst, minimally tender. Adding ultrasound and shoulder x-ray.    ___________________________________________ Ihor Austin. Benjamin Stain, M.D., ABFM., CAQSM. Primary Care and Sports Medicine [facility]  Adjunct Instructor of Family Medicine  [employer]

## 2021-07-18 NOTE — Assessment & Plan Note (Signed)
Dawn Willis returns, she is a pleasant 46 year old female, she was having right arm numbness and tingling with a nerve conduction study that showed both cervical radiculitis and carpal tunnel syndrome. She has recovered well from surgery.  Unfortunately continues to have paresthesias in the right hand.

## 2021-07-18 NOTE — Assessment & Plan Note (Signed)
Potential soft tissue mass subcutaneous tissues over the deltoid, feels to be a cyst, minimally tender. Adding ultrasound and shoulder x-ray.

## 2021-07-18 NOTE — Assessment & Plan Note (Signed)
Emotionally healthy right now though she is somewhat stressed by her husband and his brothers alcohol consumption and disease processes. She does desire to come off of her Cymbalta, we will do a down taper, she is aware of the potential worsening of mood coming off of Cymbalta. She is having significant insomnia, sleep hygiene is okay, I will send in a short course of Ambien and she can follow this up with her new PCP. Continue amitriptyline for now.

## 2021-07-18 NOTE — Addendum Note (Signed)
Addended by: Monica Becton on: 07/18/2021 04:09 PM   Modules accepted: Orders

## 2021-07-21 ENCOUNTER — Telehealth: Payer: Self-pay

## 2021-07-21 NOTE — Telephone Encounter (Signed)
Have her come in to do a self swab wet prep in a nurse visit.  We need to get a diagnosis before I treat it.

## 2021-07-21 NOTE — Telephone Encounter (Signed)
Dawn Willis states Saturday she started having vaginal itching and irritation. She states Dr T is her PCP until March 31st. We do not have any openings. She believes this is BV not yeast. Please advise.

## 2021-07-22 ENCOUNTER — Ambulatory Visit (INDEPENDENT_AMBULATORY_CARE_PROVIDER_SITE_OTHER): Payer: 59

## 2021-07-22 ENCOUNTER — Other Ambulatory Visit: Payer: Self-pay

## 2021-07-22 DIAGNOSIS — R2231 Localized swelling, mass and lump, right upper limb: Secondary | ICD-10-CM | POA: Diagnosis not present

## 2021-07-22 IMAGING — US US EXTREM UP *R* LTD
1 series · 9 of 9 positions shown · non-contrast
Comparison: None.

CLINICAL DATA: Right upper arm lump for 2 weeks.

EXAM:
ULTRASOUND LEFT UPPER EXTREMITY LIMITED
TECHNIQUE: Ultrasound examination of the upper extremity soft tissues was
performed in the area of clinical concern.

[Series 1: us soft tissue right upper extremity limited (non- · 9 acquisitions, 9 frames shown]
[im 1/9]
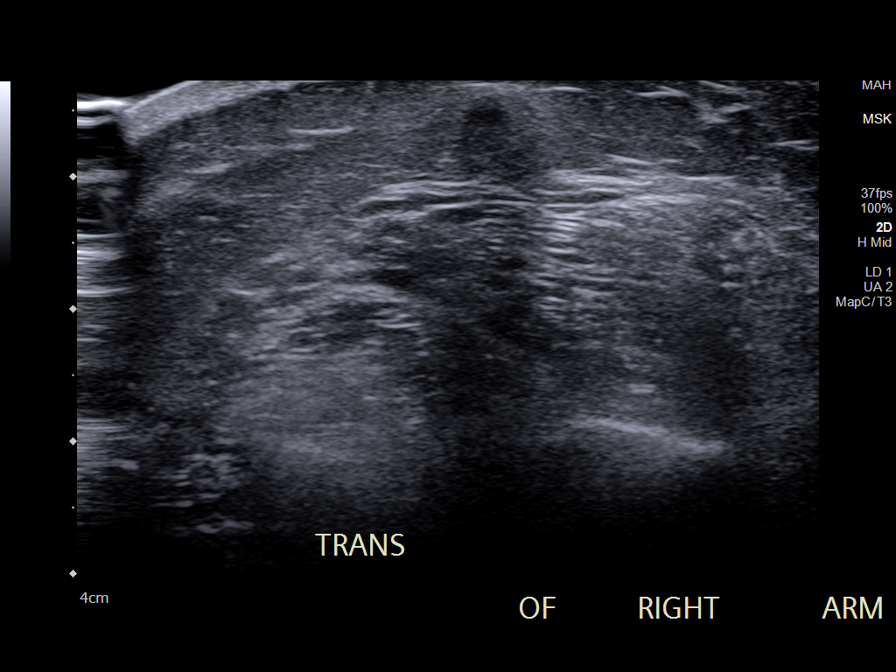
[im 2/9]
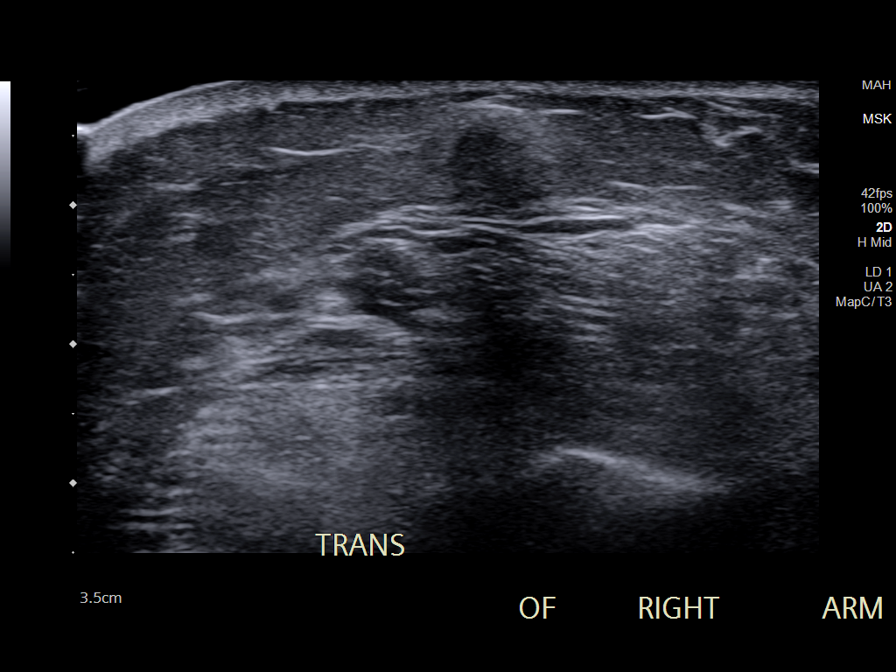
[im 3/9]
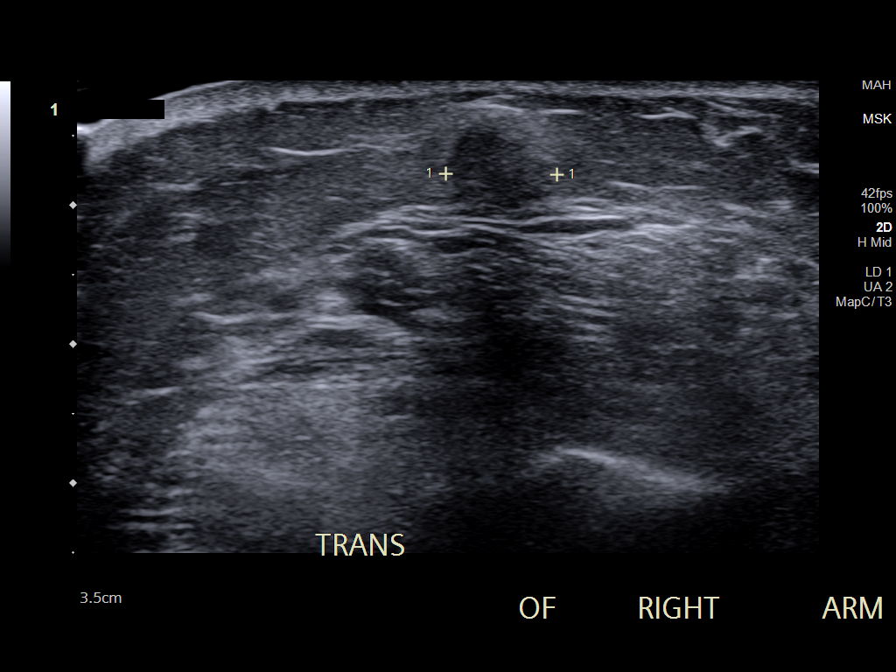
[im 4/9]
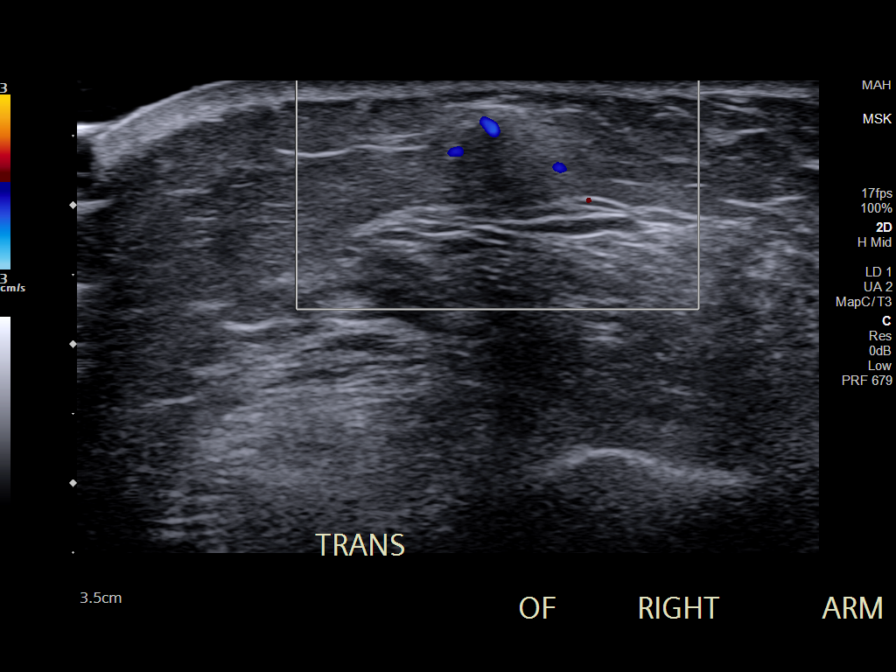
[im 5/9]
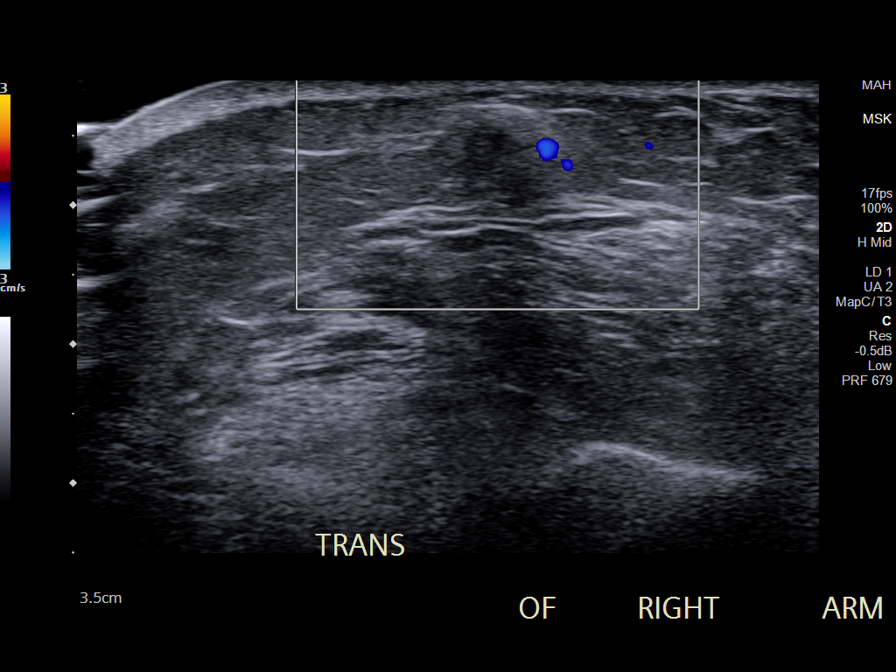
[im 6/9]
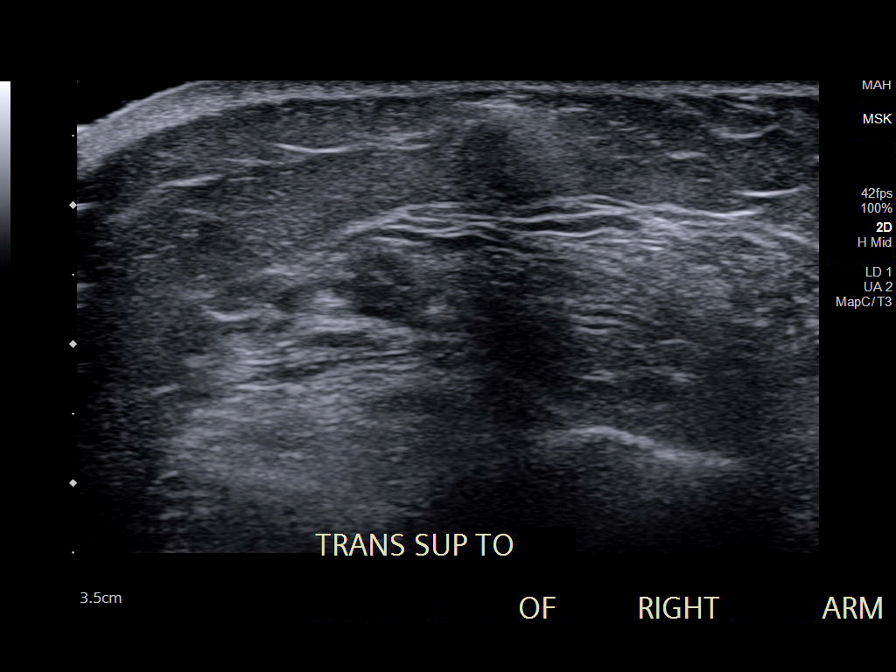
[im 7/9]
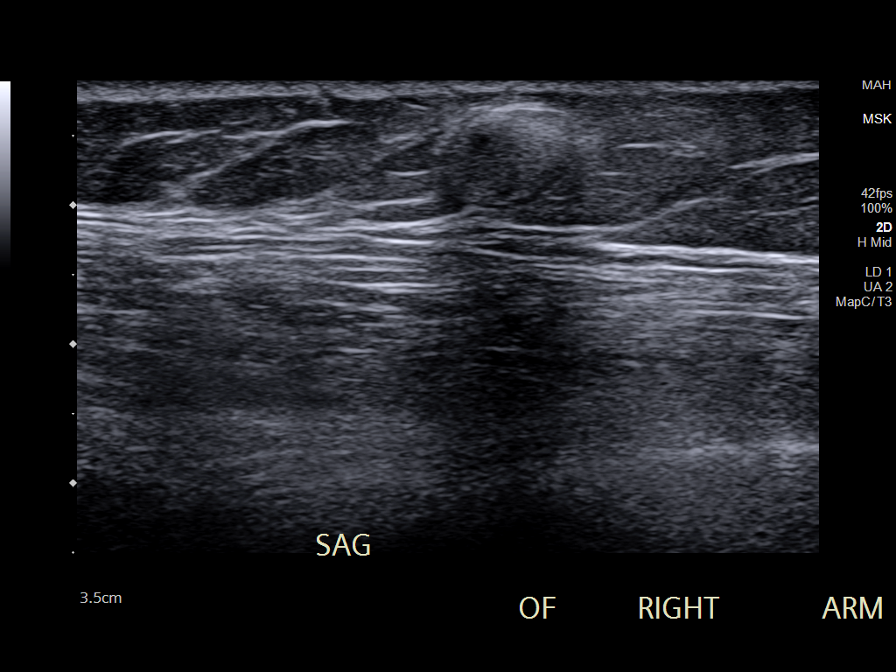
[im 8/9]
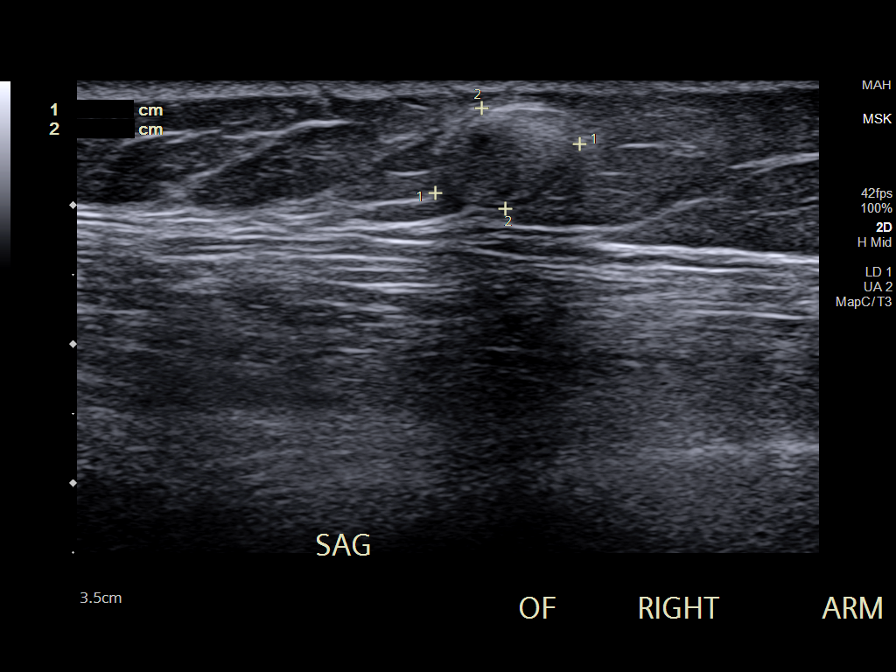
[im 9/9]
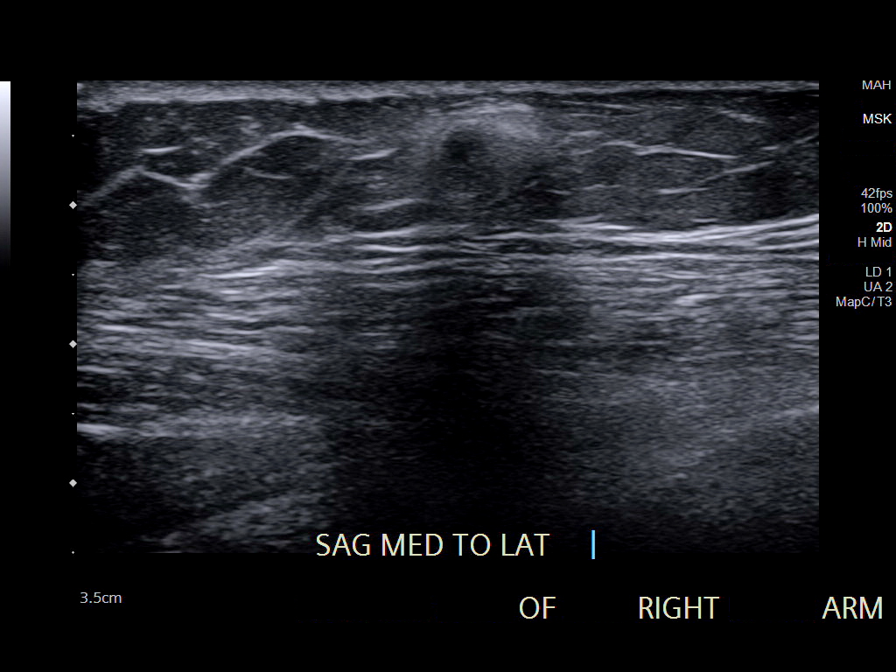

[9 of 9 positions shown; findings below may reference images not displayed]

FINDINGS: 8 mm hypoechoic nodule without internal Doppler flow in the
subcutaneous fat of the right upper arm at the site of clinical
palpable abnormality with posterior acoustic shadowing.

No other solid or cystic mass.  No fluid collection or hematoma.
IMPRESSION: 1. A 8 mm hypoechoic nodule in the subcutaneous fat of the right
upper arm at the site of clinical palpable abnormality. This may
reflect an area of fat necrosis, but is overall of indeterminate
etiology. If there is further clinical concern, recommend an MRI of
the right upper arm without and with intravenous contrast.

## 2021-07-22 NOTE — Telephone Encounter (Signed)
Patient came by and wanted to let you guys know she already had this issue taken care of at the [facility]

## 2021-08-07 ENCOUNTER — Telehealth: Payer: Self-pay

## 2021-08-07 NOTE — Telephone Encounter (Signed)
Patient called the front desk line with a complaint. She requested to speak with a Production designer, theatre/television/film. Per patient, she felt that provider's nurse (Christal) was dismissive towards the concerns patient was having at the time of her call. She continues to have dizziness, nausea and stated that she "passed out". She mentioned that her husband is a IT sales professional and was able to help her when she passed out from the dizziness.  ? ?Patient was advised that she would need to "wait out" the symptoms she is experiencing. Advised if symptoms become worse to go to UC/[facility] for care. She was agreeable with plan.  ? ?Patient is requesting if provider can amend the work note. Patient does not feel comfortable driving while having withdrawals from Cymbalta.  ?

## 2021-08-07 NOTE — Telephone Encounter (Signed)
Patient called stating that she tapered down the dose of her duloxetine and stopped it completely. Since then she is having nausea, dizziness, and panic attacks. She is requesting a work note for today and tomorrow hoping the symptoms subside by Sunday. She mentioned taking some old xanax she had on hand for the panic attacks and potentially getting some to hold her over until she can see her new PCP on Friday of next week. She also mentioned increasing her dose of amitriptyline to offset the symptoms for a period. Please advise.  ?

## 2021-08-07 NOTE — Telephone Encounter (Signed)
She was made aware that this would happen if she wanted to come off of the Cymbalta.  She probably needs to be back on it, I will write the work note, no additional medications.  I guess the bigger question is why not just restart the Cymbalta? ?

## 2021-08-07 NOTE — Telephone Encounter (Signed)
Patient aware that a work note is in [place] for her to download. She is also aware of the recommendations. She is adamant that she does not want to restart cymbalta. She wants to give it til Sunday and see how things are then. Advised patient to call back if things are not better and we can reassess at that time.  ?

## 2021-08-29 ENCOUNTER — Ambulatory Visit (INDEPENDENT_AMBULATORY_CARE_PROVIDER_SITE_OTHER): Payer: 59 | Admitting: Sports Medicine

## 2021-08-29 DIAGNOSIS — S41131S Puncture wound without foreign body of right upper arm, sequela: Secondary | ICD-10-CM

## 2021-08-29 DIAGNOSIS — R2231 Localized swelling, mass and lump, right upper limb: Secondary | ICD-10-CM

## 2021-08-29 DIAGNOSIS — G5601 Carpal tunnel syndrome, right upper limb: Secondary | ICD-10-CM
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
## 2021-08-29 NOTE — Assessment & Plan Note (Addendum)
Dawn Willis continues to have a cold sensation in her arm when using it, she has had several nerve conduction studies that were negative, nighttime splinting was ineffective for carpal tunnel. ?I am suspicious regarding a vascular injury. ?I would like vascular surgery to weigh in. ?

## 2021-08-29 NOTE — Assessment & Plan Note (Signed)
Ultrasound showed what appears to be a subcutaneous sebaceous cyst, we can watch this for now. ?

## 2021-08-29 NOTE — Assessment & Plan Note (Signed)
EMG and nerve conduction study confirmed carpal tunnel syndrome on the right, symptoms predominantly nocturnal, she also tells me she has had thoracic outlet syndrome surgery in the past. ?Symptoms are worse with abduction of her right shoulder though she does not lose her pulse during this maneuver. ?We added some nighttime splinting and this has not helped her symptomatology. ?Though this may represent carpal tunnel syndrome I am hesitant to do a median nerve hydrodissection/injection until we get an opinion from vascular surgery. ?Her symptoms proximal to the wrist and elbow are relatively atypical for carpal tunnel syndrome. ?If there is a negative vascular surgery evaluation then we will consider median nerve hydrodissection. ?

## 2021-10-15 ENCOUNTER — Other Ambulatory Visit: Payer: Self-pay | Admitting: *Deleted

## 2021-10-15 DIAGNOSIS — M25529 Pain in unspecified elbow: Secondary | ICD-10-CM

## 2021-10-28 ENCOUNTER — Ambulatory Visit (HOSPITAL_COMMUNITY)
Admission: RE | Admit: 2021-10-28 | Discharge: 2021-10-28 | Disposition: A | Discharge status: Home / Self Care | Payer: 59 | Source: Ambulatory Visit | Attending: Vascular Surgery | Admitting: Vascular Surgery

## 2021-10-28 ENCOUNTER — Ambulatory Visit (INDEPENDENT_AMBULATORY_CARE_PROVIDER_SITE_OTHER): Payer: 59 | Admitting: Vascular Surgery

## 2021-10-28 ENCOUNTER — Encounter: Payer: Self-pay | Admitting: Vascular Surgery

## 2021-10-28 VITALS — BP 121/83 | HR 73 | Temp 98.5°F | Resp 16 | Ht 65.0 in | Wt 162.0 lb

## 2021-10-28 DIAGNOSIS — M79621 Pain in right upper arm: Secondary | ICD-10-CM | POA: Diagnosis not present

## 2021-10-28 DIAGNOSIS — M25529 Pain in unspecified elbow: Secondary | ICD-10-CM | POA: Diagnosis present

## 2021-10-28 DIAGNOSIS — S41131S Puncture wound without foreign body of right upper arm, sequela: Secondary | ICD-10-CM
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
## 2022-01-09 ENCOUNTER — Other Ambulatory Visit: Payer: Self-pay | Admitting: Sports Medicine

## 2024-02-01 ENCOUNTER — Encounter: Payer: Self-pay | Admitting: Sports Medicine
